# Patient Record
Sex: Female | Born: 1980 | Race: White | Hispanic: No | Marital: Married | Smoking: Never smoker
Health system: Southern US, Community
[De-identification: ages and names within clinical notes are randomized; demographics above are authoritative.]

---

## 2008-07-10 ENCOUNTER — Emergency Department (HOSPITAL_COMMUNITY): Admission: EM | Admit: 2008-07-10 | Discharge: 2008-07-10 | Payer: Self-pay | Admitting: Emergency Medicine

## 2009-06-08 IMAGING — CT CT HEAD W/O CM
1 series · 16 of 30 positions shown, 20 images · non-contrast
Comparison: No priors

CLINICAL DATA: Syncope/question seizure

CT HEAD WITHOUT CONTRAST
TECHNIQUE: Contiguous axial images were obtained from the base of
the skull through the vertex without contrast.

[Series 2: head_seq 4.5 h37s st · axial · 0.43mm/px · z∈[-127,-1]mm · 16 of 32 slices shown, 20 images]
[im 2/32  brain]
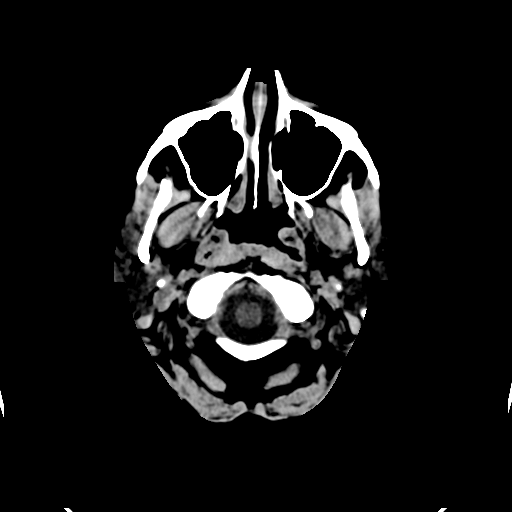
[im 2/32  bone]
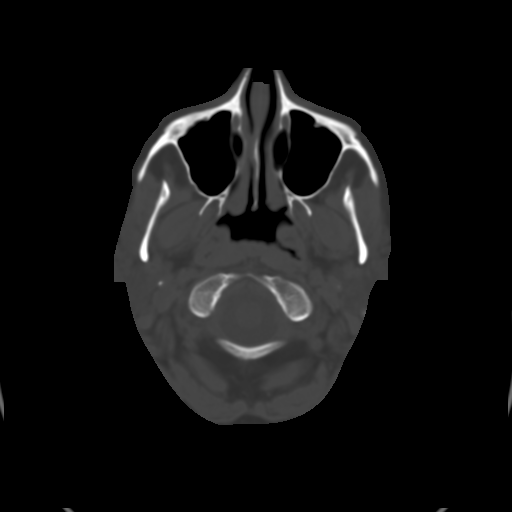
[im 4/32  brain]
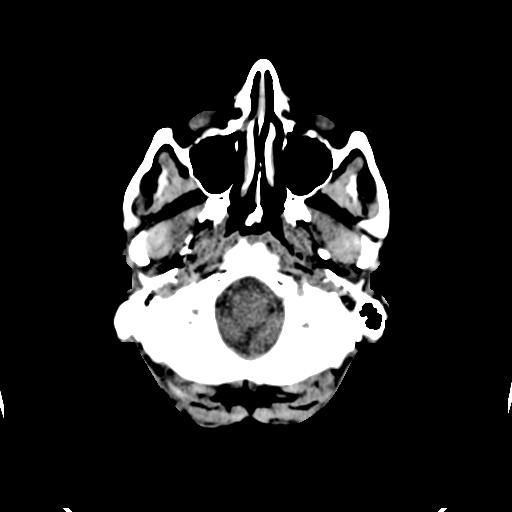
[im 6/32  brain]
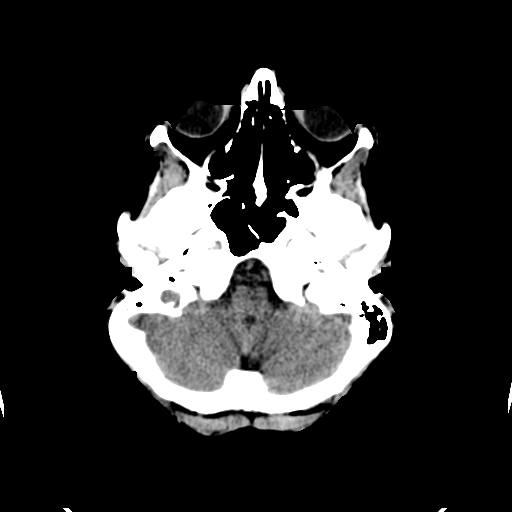
[im 8/32  brain]
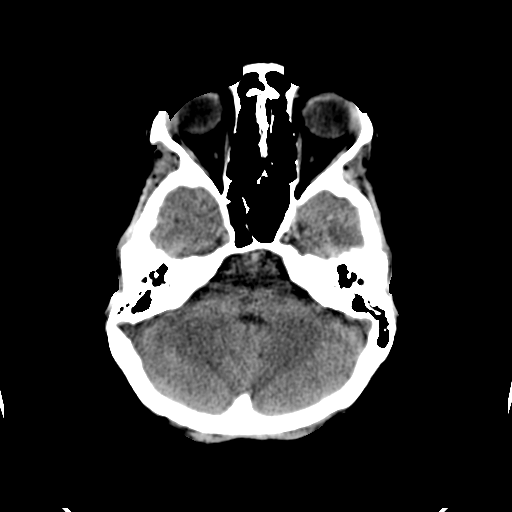
[im 9/32  brain]
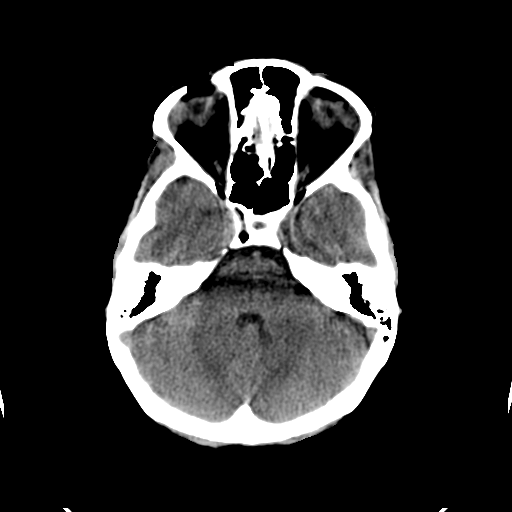
[im 9/32  bone]
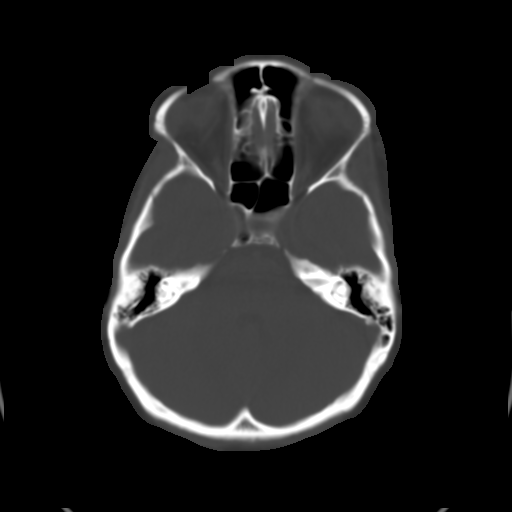
[im 11/32  brain]
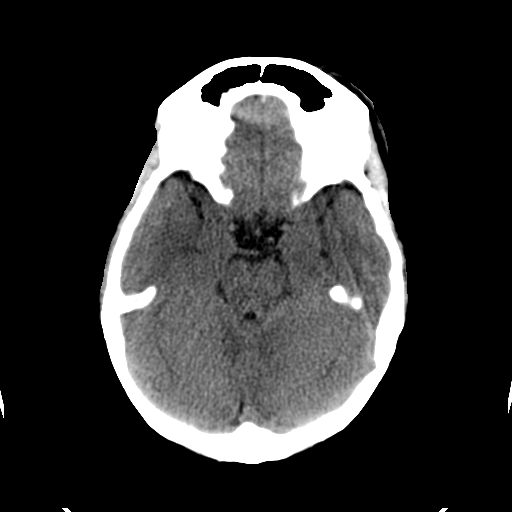
[im 13/32  brain]
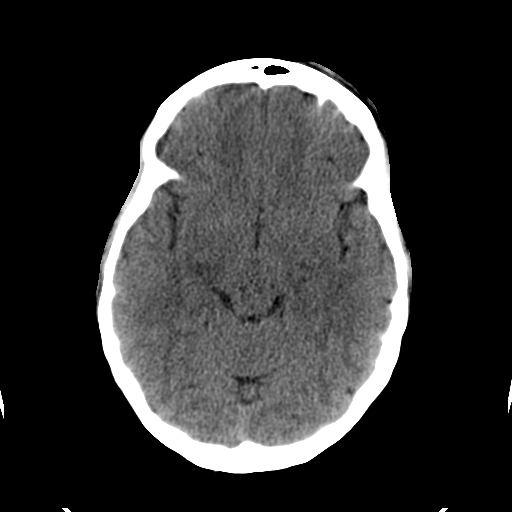
[im 15/32  brain]
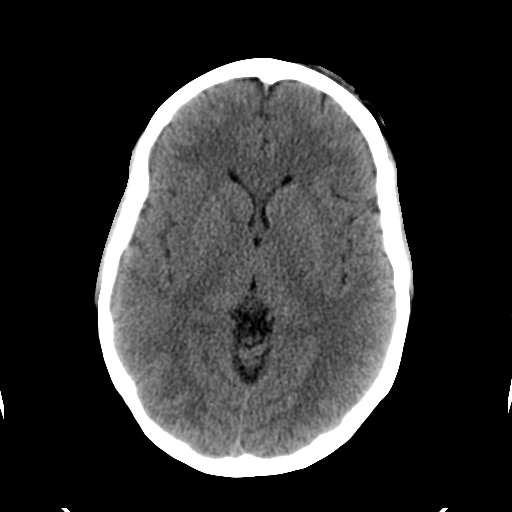
[im 17/32  brain]
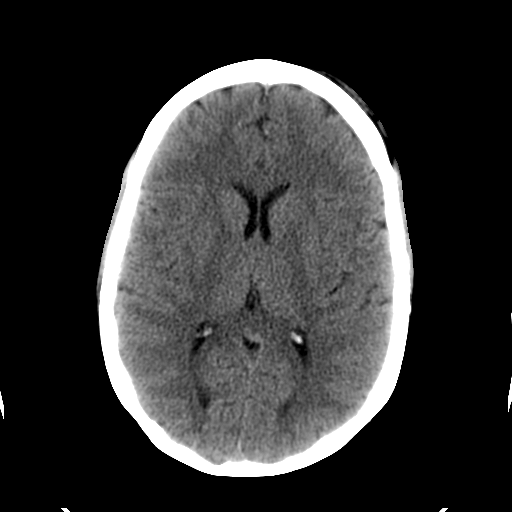
[im 17/32  bone]
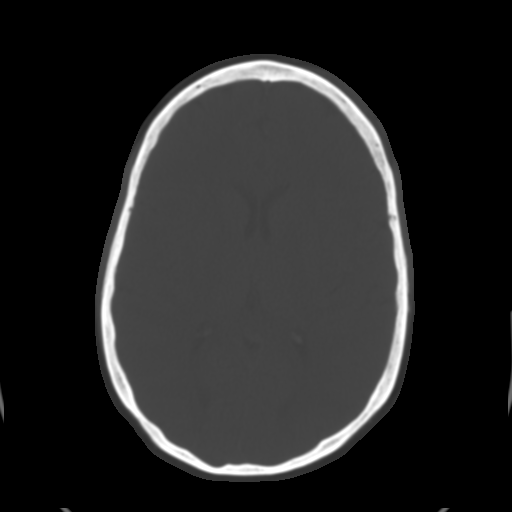
[im 19/32  brain]
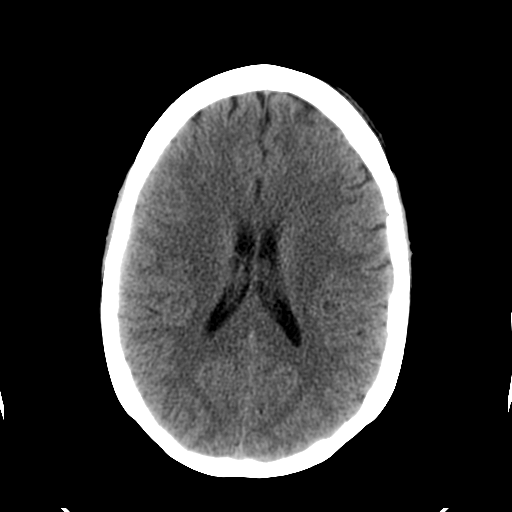
[im 21/32  brain]
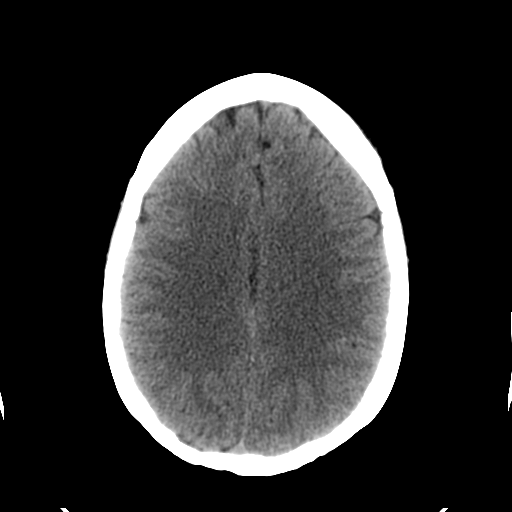
[im 23/32  brain]
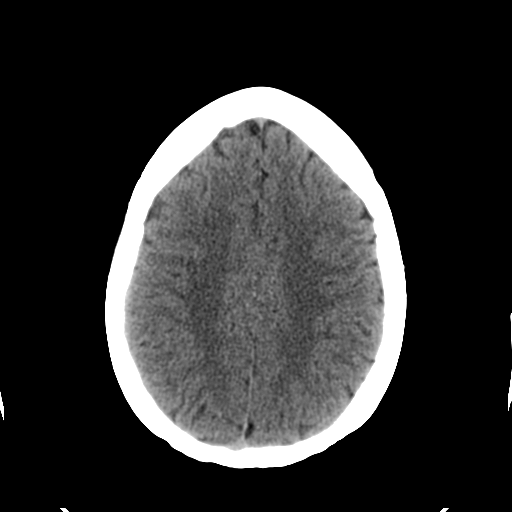
[im 24/32  brain]
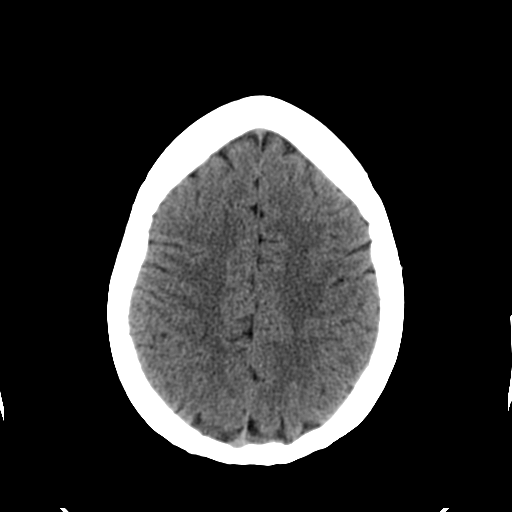
[im 24/32  bone]
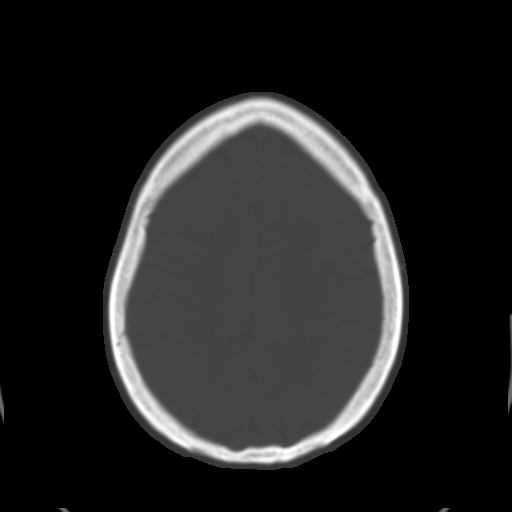
[im 26/32  brain]
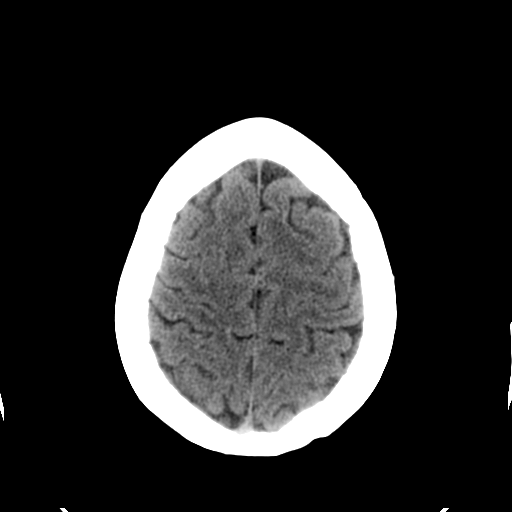
[im 28/32  brain]
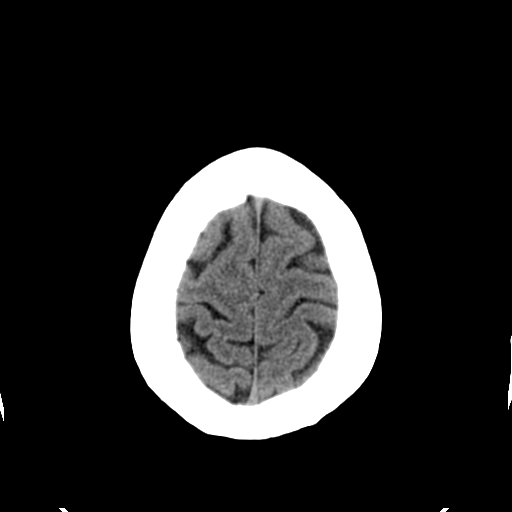
[im 30/32  brain]
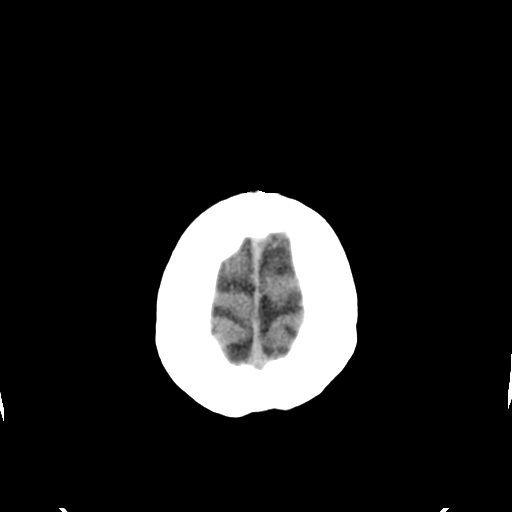

[16 of 30 positions shown; findings below may reference images not displayed]

FINDINGS: Ventricular size and CSF spaces normal.  No acute
infarct, bleed, or mass effect.  Calvarium intact.

There is soft tissue swelling and sub galeal hematoma over the left
orbit and left frontal area.
IMPRESSION: Normal except for soft tissue swelling with sub galeal hematoma
over the left orbit and frontal region.

## 2009-12-02 DIAGNOSIS — G4700 Insomnia, unspecified: Secondary | ICD-10-CM | POA: Insufficient documentation

## 2009-12-02 DIAGNOSIS — G2581 Restless legs syndrome: Secondary | ICD-10-CM | POA: Insufficient documentation

## 2011-03-23 DIAGNOSIS — F902 Attention-deficit hyperactivity disorder, combined type: Secondary | ICD-10-CM | POA: Insufficient documentation

## 2011-03-23 DIAGNOSIS — G40909 Epilepsy, unspecified, not intractable, without status epilepticus: Secondary | ICD-10-CM | POA: Insufficient documentation

## 2011-03-23 DIAGNOSIS — F419 Anxiety disorder, unspecified: Secondary | ICD-10-CM | POA: Insufficient documentation

## 2011-05-08 LAB — URINALYSIS, ROUTINE W REFLEX MICROSCOPIC
Bilirubin Urine: NEGATIVE
Glucose, UA: NEGATIVE mg/dL
Hgb urine dipstick: NEGATIVE
Ketones, ur: NEGATIVE mg/dL
Protein, ur: NEGATIVE mg/dL
Urobilinogen, UA: 1 mg/dL (ref 0.0–1.0)

## 2011-05-08 LAB — CBC
HCT: 36.2 % (ref 36.0–46.0)
Hemoglobin: 11.7 g/dL — ABNORMAL LOW (ref 12.0–15.0)
Platelets: 242 10*3/uL (ref 150–400)
RDW: 15.6 % — ABNORMAL HIGH (ref 11.5–15.5)
WBC: 10.3 10*3/uL (ref 4.0–10.5)

## 2011-05-08 LAB — DIFFERENTIAL
Basophils Absolute: 0.1 10*3/uL (ref 0.0–0.1)
Basophils Relative: 1 % (ref 0–1)
Eosinophils Absolute: 0.1 10*3/uL (ref 0.0–0.7)
Monocytes Absolute: 0.8 10*3/uL (ref 0.1–1.0)
Monocytes Relative: 8 % (ref 3–12)
Neutro Abs: 8.5 10*3/uL — ABNORMAL HIGH (ref 1.7–7.7)

## 2011-05-08 LAB — COMPREHENSIVE METABOLIC PANEL
ALT: 29 U/L (ref 0–35)
Albumin: 3.8 g/dL (ref 3.5–5.2)
Alkaline Phosphatase: 97 U/L (ref 39–117)
BUN: 11 mg/dL (ref 6–23)
Chloride: 107 mEq/L (ref 96–112)
Glucose, Bld: 90 mg/dL (ref 70–99)
Potassium: 4.1 mEq/L (ref 3.5–5.1)
Sodium: 138 mEq/L (ref 135–145)
Total Bilirubin: 0.6 mg/dL (ref 0.3–1.2)
Total Protein: 6.7 g/dL (ref 6.0–8.3)

## 2011-05-08 LAB — URINE MICROSCOPIC-ADD ON

## 2013-01-07 DIAGNOSIS — F339 Major depressive disorder, recurrent, unspecified: Secondary | ICD-10-CM | POA: Insufficient documentation

## 2016-01-09 DIAGNOSIS — K219 Gastro-esophageal reflux disease without esophagitis: Secondary | ICD-10-CM | POA: Insufficient documentation

## 2016-01-09 DIAGNOSIS — G4733 Obstructive sleep apnea (adult) (pediatric): Secondary | ICD-10-CM | POA: Insufficient documentation

## 2017-05-13 DIAGNOSIS — E6609 Other obesity due to excess calories: Secondary | ICD-10-CM | POA: Insufficient documentation

## 2019-03-08 ENCOUNTER — Ambulatory Visit (INDEPENDENT_AMBULATORY_CARE_PROVIDER_SITE_OTHER): Payer: BC Managed Care – PPO | Admitting: Adult Health

## 2019-03-08 ENCOUNTER — Encounter (INDEPENDENT_AMBULATORY_CARE_PROVIDER_SITE_OTHER): Payer: Self-pay

## 2019-03-08 ENCOUNTER — Other Ambulatory Visit: Payer: Self-pay

## 2019-03-08 DIAGNOSIS — F909 Attention-deficit hyperactivity disorder, unspecified type: Secondary | ICD-10-CM

## 2019-03-08 DIAGNOSIS — G4700 Insomnia, unspecified: Secondary | ICD-10-CM | POA: Diagnosis not present

## 2019-03-08 DIAGNOSIS — F411 Generalized anxiety disorder: Secondary | ICD-10-CM | POA: Diagnosis not present

## 2019-03-08 DIAGNOSIS — F331 Major depressive disorder, recurrent, moderate: Secondary | ICD-10-CM

## 2019-03-09 ENCOUNTER — Other Ambulatory Visit: Payer: Self-pay

## 2019-03-10 ENCOUNTER — Other Ambulatory Visit: Payer: Self-pay

## 2019-03-10 MED ORDER — DULOXETINE HCL 60 MG PO CSDR: 60.0000 mg | DELAYED_RELEASE_CAPSULE | Freq: Every day | ORAL | 5 refills | Status: DC

## 2019-03-10 MED ORDER — DULOXETINE HCL 30 MG PO CSDR: 30.0000 mg | DELAYED_RELEASE_CAPSULE | Freq: Every day | ORAL | 5 refills | Status: DC

## 2019-03-13 ENCOUNTER — Other Ambulatory Visit: Payer: Self-pay

## 2019-03-13 MED ORDER — DULOXETINE HCL 60 MG PO CPEP: 60.0000 mg | ORAL_CAPSULE | Freq: Every day | ORAL | 5 refills | Status: DC

## 2019-03-13 MED ORDER — DULOXETINE HCL 30 MG PO CPEP: 30.0000 mg | ORAL_CAPSULE | Freq: Every day | ORAL | 5 refills | Status: DC

## 2019-03-25 ENCOUNTER — Encounter: Payer: Self-pay | Admitting: Adult Health

## 2019-03-25 DIAGNOSIS — R0683 Snoring: Secondary | ICD-10-CM | POA: Insufficient documentation

## 2019-03-25 DIAGNOSIS — Z9889 Other specified postprocedural states: Secondary | ICD-10-CM | POA: Insufficient documentation

## 2019-03-25 NOTE — Progress Notes (Signed)
Tiffany Francis [id number] [birth year] 38 y.o.  Subjective:   Patient ID:  Tiffany Francis is a 38 y.o. (DOB [birth year]) female.  Chief Complaint: No chief complaint on file.   HPI Tiffany Francis presents to the office today for follow-up of ADHD, anxiety, depression, insomnia.  Describes mood today as "so-so". Pleasant. Mood symptoms - reports  depression, anxiety, and irritability. She and husband having issues. Has attended marriage counseling and also decided to pursue individual therapy. Husband has told her he doesn't "love" her anymore. Has been frustrated with her not getting things done. Has not been doing things around the house. Addition of Adderall has not helped. 39 year old son recently had a baby. Lives with girlfriend and her family. 44 year old son frustrated with going back to school. Work going better - has gotten some help. Stable interest and motivation. Taking medications as prescribed.  Energy levels decreased overall. Active, does not have a regular exercise routine. Works full time. Enjoys some usual interests and activities. Spending time with family. Appetite adequate. Weight stable. Sleeps well most nights. Averages 6 to 8 hours. Sleeping in a separate room.  Focus and concentration stable. Completing tasks. Managing aspects of household.  Denies SI or HI. Denies AH or VH.  Review of Systems:  Review of Systems  Musculoskeletal: Negative for gait problem.  Neurological: Negative for tremors.  Psychiatric/Behavioral:       Please refer to HPI    Medications: I have reviewed the patient's current medications.  Current Outpatient Medications  Medication Sig Dispense Refill  . DULoxetine (CYMBALTA) 30 MG capsule Take 1 capsule (30 mg total) by mouth daily. 30 capsule 5  . DULoxetine (CYMBALTA) 60 MG capsule Take 1 capsule (60 mg total) by mouth daily. 30 capsule 5   No current facility-administered medications for this visit.     Medication Side Effects:  None  Allergies: Not on File  No past medical history on file.  No family history on file.  Social History   Socioeconomic History  . Marital status: Married    Spouse name: Not on file  . Number of children: Not on file  . Years of education: Not on file  . Highest education level: Not on file  Occupational History  . Not on file  Social Needs  . Financial resource strain: Not on file  . Food insecurity    Worry: Not on file    Inability: Not on file  . Transportation needs    Medical: Not on file    Non-medical: Not on file  Tobacco Use  . Smoking status: Not on file  Substance and Sexual Activity  . Alcohol use: Not on file  . Drug use: Not on file  . Sexual activity: Not on file  Lifestyle  . Physical activity    Days per week: Not on file    Minutes per session: Not on file  . Stress: Not on file  Relationships  . Social Herbalist on phone: Not on file    Gets together: Not on file    Attends religious service: Not on file    Active member of club or organization: Not on file    Attends meetings of clubs or organizations: Not on file    Relationship status: Not on file  . Intimate partner violence    Fear of current or ex partner: Not on file    Emotionally abused: Not on file    Physically abused: Not  on file    Forced sexual activity: Not on file  Other Topics Concern  . Not on file  Social History Narrative  . Not on file    Past Medical History, Surgical history, Social history, and Family history were reviewed and updated as appropriate.   Please see review of systems for further details on the patient's review from today.   Objective:   Physical Exam:  There were no vitals taken for this visit.  Physical Exam Constitutional:      General: She is not in acute distress.    Appearance: She is well-developed.  Musculoskeletal:        General: No deformity.  Neurological:     Mental Status: She is alert and oriented to person,  place, and time.     Coordination: Coordination normal.  Psychiatric:        Attention and Perception: Attention and perception normal. She does not perceive auditory or visual hallucinations.        Mood and Affect: Mood normal. Mood is not anxious or depressed. Affect is not labile, blunt, angry or inappropriate.        Speech: Speech normal.        Behavior: Behavior normal.        Thought Content: Thought content normal. Thought content is not paranoid or delusional. Thought content does not include homicidal or suicidal ideation. Thought content does not include homicidal or suicidal plan.        Cognition and Memory: Cognition and memory normal.        Judgment: Judgment normal.     Comments: Insight intact     Lab Review:     Component Value Date/Time   NA 138 07/10/2008 1020   K 4.1 07/10/2008 1020   CL 107 07/10/2008 1020   CO2 24 07/10/2008 1020   GLUCOSE 90 07/10/2008 1020   BUN 11 07/10/2008 1020   CREATININE 0.66 07/10/2008 1020   CALCIUM 8.7 07/10/2008 1020   PROT 6.7 07/10/2008 1020   ALBUMIN 3.8 07/10/2008 1020   AST 26 07/10/2008 1020   ALT 29 07/10/2008 1020   ALKPHOS 97 07/10/2008 1020   BILITOT 0.6 07/10/2008 1020   GFRNONAA >60 07/10/2008 1020   GFRAA  07/10/2008 1020    >60        The eGFR has been calculated using the MDRD equation. This calculation has not been validated in all clinical       Component Value Date/Time   WBC 10.3 07/10/2008 1020   RBC 4.51 07/10/2008 1020   HGB 11.7 (L) 07/10/2008 1020   HCT 36.2 07/10/2008 1020   PLT 242 07/10/2008 1020   MCV 80.3 07/10/2008 1020   MCHC 32.2 07/10/2008 1020   RDW 15.6 (H) 07/10/2008 1020   LYMPHSABS 0.8 07/10/2008 1020   MONOABS 0.8 07/10/2008 1020   EOSABS 0.1 07/10/2008 1020   BASOSABS 0.1 07/10/2008 1020    No results found for: POCLITH, LITHIUM   No results found for: PHENYTOIN, PHENOBARB, VALPROATE, CBMZ   .res Assessment: Plan:    Plan:  1. D/C Adderall 87m tablet in the  afternoon 2. Increase Cymbalta 60/30 to 60/60 to target depression 3. Paxil 453mdaily 4. Xanax 33m72m 4 x daily 5. Vyvanse 5m77mily 6. Abilify 10mg49mly  RTC 4 weeks  Patient advised to contact office with any questions, adverse effects, or acute worsening in signs and symptoms.  Discussed potential metabolic side effects associated with atypical antipsychotics, as well  as potential risk for movement side effects. Advised pt to contact office if movement side effects occur.   Discussed potential benefits, risks, and side effects of stimulants with patient to include increased heart rate, palpitations, insomnia, increased anxiety, increased irritability, or decreased appetite.  Instructed patient to contact office if experiencing any significant tolerability issues.  Discussed potential benefits, risk, and side effects of benzodiazepines to include potential risk of tolerance and dependence, as well as possible drowsiness.  Advised patient not to drive if experiencing drowsiness and to take lowest possible effective dose to minimize risk of dependence and tolerance.  There are no diagnoses linked to this encounter.   Please see After Visit Summary for patient specific instructions.  Future Appointments  Date Time Provider [place]  04/05/2019  5:20 PM Dunya Meiners, Berdie Ogren, NP [facility] None    No orders of the defined types were placed in this encounter.   -------------------------------

## 2019-03-28 ENCOUNTER — Telehealth: Payer: Self-pay | Admitting: Adult Health

## 2019-03-28 NOTE — Telephone Encounter (Signed)
Last refill 03/14/2019 not sure why she's requesting refill prior to appt. Too soon to fill yet.

## 2019-03-28 NOTE — Telephone Encounter (Signed)
Patient called and said that she needs a refill on her vyvanse 60 mg  To be sent to the [facility] on [address] in [place]. Her next appt is 9/2

## 2019-04-05 ENCOUNTER — Ambulatory Visit: Payer: BC Managed Care – PPO | Admitting: Adult Health

## 2019-04-05 ENCOUNTER — Ambulatory Visit (INDEPENDENT_AMBULATORY_CARE_PROVIDER_SITE_OTHER): Payer: BC Managed Care – PPO | Admitting: Adult Health

## 2019-04-05 ENCOUNTER — Other Ambulatory Visit: Payer: Self-pay

## 2019-04-05 ENCOUNTER — Encounter: Payer: Self-pay | Admitting: Adult Health

## 2019-04-05 DIAGNOSIS — F902 Attention-deficit hyperactivity disorder, combined type: Secondary | ICD-10-CM | POA: Diagnosis not present

## 2019-04-05 DIAGNOSIS — G4700 Insomnia, unspecified: Secondary | ICD-10-CM | POA: Diagnosis not present

## 2019-04-05 DIAGNOSIS — F411 Generalized anxiety disorder: Secondary | ICD-10-CM

## 2019-04-05 DIAGNOSIS — F331 Major depressive disorder, recurrent, moderate: Secondary | ICD-10-CM | POA: Diagnosis not present

## 2019-04-05 MED ORDER — LISDEXAMFETAMINE DIMESYLATE 60 MG PO CAPS
60.0000 mg | ORAL_CAPSULE | ORAL | 0 refills | Status: DC
Start: 2019-05-03 — End: 2019-07-05

## 2019-04-05 MED ORDER — DULOXETINE HCL 60 MG PO CPEP: 60.0000 mg | ORAL_CAPSULE | Freq: Two times a day (BID) | ORAL | 5 refills | Status: DC

## 2019-04-05 MED ORDER — LISDEXAMFETAMINE DIMESYLATE 60 MG PO CAPS: 60.0000 mg | ORAL_CAPSULE | Freq: Every day | ORAL | 0 refills | Status: DC

## 2019-04-05 MED ORDER — LISDEXAMFETAMINE DIMESYLATE 60 MG PO CAPS: 60.0000 mg | ORAL_CAPSULE | ORAL | 0 refills | Status: DC

## 2019-04-05 NOTE — Progress Notes (Signed)
Tiffany Francis [id number] 08/12/1980 38 y.o.  Subjective:   Patient ID:  Tiffany Francis is a 38 y.o. (DOB Nov 22, 1980) female.  Chief Complaint: No chief complaint on file.   HPI Tiffany Francis presents to the office today for follow-up of ADHD, anxiety, depression, insomnia.  Describes mood today as "ok". Pleasant. Mood symptoms - reports decreased depression, anxiety, and irritability. She and husband are talking, but not about their relationship. Has stopped therapy. Stating "my son only calls me when he needs something". Called yesterday wanting "money". Has been able to see grandson once a week. Doing a "little" better getting things done around the house. Work going well - returning to office soon.  Stable interest and motivation. Taking medications as prescribed.  Energy levels decreased overall. Active, does not have a regular exercise routine. Works full time for [employer] Enjoys some usual interests and activities. Spending time with family - husband and son "[place]". Appetite adequate. Weight stable. Sleeps well most nights. Averages 6 to 8 hours.  Focus and concentration stable. Completing tasks. Managing aspects of household.  Denies SI or HI. Denies AH or VH.  Review of Systems:  Review of Systems  Musculoskeletal: Negative for gait problem.  Neurological: Negative for tremors.  Psychiatric/Behavioral:       Please refer to HPI    Medications: I have reviewed the patient's current medications.  Current Outpatient Medications  Medication Sig Dispense Refill  . ALPRAZolam (XANAX) 1 MG tablet take 1 tablet by mouth four times a day if needed for anxiety    . ARIPiprazole (ABILIFY) 10 MG tablet TK 1 T PO QD    . DULoxetine (CYMBALTA) 60 MG capsule Take 1 capsule (60 mg total) by mouth 2 (two) times daily. 60 capsule 5  . lisdexamfetamine (VYVANSE) 60 MG capsule Take 1 capsule (60 mg total) by mouth daily. 30 capsule 0  . [START ON 05/03/2019] lisdexamfetamine (VYVANSE) 60  MG capsule Take 1 capsule (60 mg total) by mouth every morning. 30 capsule 0  . [START ON 05/31/2019] lisdexamfetamine (VYVANSE) 60 MG capsule Take 1 capsule (60 mg total) by mouth every morning. 30 capsule 0  . meloxicam (MOBIC) 15 MG tablet     . omeprazole (PRILOSEC) 40 MG capsule     . rOPINIRole (REQUIP) 1 MG tablet Take one tablet at bedtime.    Marland Kitchen tiZANidine (ZANAFLEX) 4 MG tablet TK 1 TO 1 AND 1/2 TS PO UP TO TID PRN     No current facility-administered medications for this visit.     Medication Side Effects: None  Allergies: Not on File  No past medical history on file.  No family history on file.  Social History   Socioeconomic History  . Marital status: Married    Spouse name: Not on file  . Number of children: Not on file  . Years of education: Not on file  . Highest education level: Not on file  Occupational History  . Not on file  Social Needs  . Financial resource strain: Not on file  . Food insecurity    Worry: Not on file    Inability: Not on file  . Transportation needs    Medical: Not on file    Non-medical: Not on file  Tobacco Use  . Smoking status: Never Smoker  . Smokeless tobacco: Never Used  Substance and Sexual Activity  . Alcohol use: Not on file  . Drug use: Not on file  . Sexual activity: Not on file  Lifestyle  .  Physical activity    Days per week: Not on file    Minutes per session: Not on file  . Stress: Not on file  Relationships  . Social Herbalist on phone: Not on file    Gets together: Not on file    Attends religious service: Not on file    Active member of club or organization: Not on file    Attends meetings of clubs or organizations: Not on file    Relationship status: Not on file  . Intimate partner violence    Fear of current or ex partner: Not on file    Emotionally abused: Not on file    Physically abused: Not on file    Forced sexual activity: Not on file  Other Topics Concern  . Not on file  Social  History Narrative  . Not on file    Past Medical History, Surgical history, Social history, and Family history were reviewed and updated as appropriate.   Please see review of systems for further details on the patient's review from today.   Objective:   Physical Exam:  There were no vitals taken for this visit.  Physical Exam Constitutional:      General: She is not in acute distress.    Appearance: She is well-developed.  Musculoskeletal:        General: No deformity.  Neurological:     Mental Status: She is alert and oriented to person, place, and time.     Coordination: Coordination normal.  Psychiatric:        Attention and Perception: Attention and perception normal. She does not perceive auditory or visual hallucinations.        Mood and Affect: Mood normal. Mood is not anxious or depressed. Affect is not labile, blunt, angry or inappropriate.        Speech: Speech normal.        Behavior: Behavior normal.        Thought Content: Thought content normal. Thought content is not paranoid or delusional. Thought content does not include homicidal or suicidal ideation. Thought content does not include homicidal or suicidal plan.        Cognition and Memory: Cognition and memory normal.        Judgment: Judgment normal.     Comments: Insight intact     Lab Review:     Component Value Date/Time   NA 138 07/10/2008 1020   K 4.1 07/10/2008 1020   CL 107 07/10/2008 1020   CO2 24 07/10/2008 1020   GLUCOSE 90 07/10/2008 1020   BUN 11 07/10/2008 1020   CREATININE 0.66 07/10/2008 1020   CALCIUM 8.7 07/10/2008 1020   PROT 6.7 07/10/2008 1020   ALBUMIN 3.8 07/10/2008 1020   AST 26 07/10/2008 1020   ALT 29 07/10/2008 1020   ALKPHOS 97 07/10/2008 1020   BILITOT 0.6 07/10/2008 1020   GFRNONAA >60 07/10/2008 1020   GFRAA  07/10/2008 1020    >60        The eGFR has been calculated using the MDRD equation. This calculation has not been validated in all clinical        Component Value Date/Time   WBC 10.3 07/10/2008 1020   RBC 4.51 07/10/2008 1020   HGB 11.7 (L) 07/10/2008 1020   HCT 36.2 07/10/2008 1020   PLT 242 07/10/2008 1020   MCV 80.3 07/10/2008 1020   MCHC 32.2 07/10/2008 1020   RDW 15.6 (H) 07/10/2008 1020  LYMPHSABS 0.8 07/10/2008 1020   MONOABS 0.8 07/10/2008 1020   EOSABS 0.1 07/10/2008 1020   BASOSABS 0.1 07/10/2008 1020    No results found for: POCLITH, LITHIUM   No results found for: PHENYTOIN, PHENOBARB, VALPROATE, CBMZ   .res Assessment: Plan:    Plan:  Increase Cymbalta 60/60 to target depression Paxil 71m daily Xanax 176m- 4 x daily Vyvanse 6012maily  Abilify 75m62mily  RTC 4 weeks  Patient advised to contact office with any questions, adverse effects, or acute worsening in signs and symptoms.  Discussed potential metabolic side effects associated with atypical antipsychotics, as well as potential risk for movement side effects. Advised pt to contact office if movement side effects occur.   Discussed potential benefits, risks, and side effects of stimulants with patient to include increased heart rate, palpitations, insomnia, increased anxiety, increased irritability, or decreased appetite.  Instructed patient to contact office if experiencing any significant tolerability issues.  Discussed potential benefits, risk, and side effects of benzodiazepines to include potential risk of tolerance and dependence, as well as possible drowsiness.  Advised patient not to drive if experiencing drowsiness and to take lowest possible effective dose to minimize risk of dependence and tolerance.  Diagnoses and all orders for this visit:  Generalized anxiety disorder -     DULoxetine (CYMBALTA) 60 MG capsule; Take 1 capsule (60 mg total) by mouth 2 (two) times daily.  Major depressive disorder, recurrent episode, moderate (HCC) -     DULoxetine (CYMBALTA) 60 MG capsule; Take 1 capsule (60 mg total) by mouth 2 (two) times  daily.  Attention deficit hyperactivity disorder (ADHD), combined type -     lisdexamfetamine (VYVANSE) 60 MG capsule; Take 1 capsule (60 mg total) by mouth daily. -     lisdexamfetamine (VYVANSE) 60 MG capsule; Take 1 capsule (60 mg total) by mouth every morning. -     lisdexamfetamine (VYVANSE) 60 MG capsule; Take 1 capsule (60 mg total) by mouth every morning.  Insomnia, unspecified type     Please see After Visit Summary for patient specific instructions.  No future appointments.  No orders of the defined types were placed in this encounter.   -------------------------------

## 2019-07-05 ENCOUNTER — Encounter: Payer: Self-pay | Admitting: Adult Health

## 2019-07-05 ENCOUNTER — Ambulatory Visit (INDEPENDENT_AMBULATORY_CARE_PROVIDER_SITE_OTHER): Payer: BC Managed Care – PPO | Admitting: Adult Health

## 2019-07-05 ENCOUNTER — Encounter (INDEPENDENT_AMBULATORY_CARE_PROVIDER_SITE_OTHER): Payer: Self-pay

## 2019-07-05 ENCOUNTER — Other Ambulatory Visit: Payer: Self-pay

## 2019-07-05 DIAGNOSIS — F902 Attention-deficit hyperactivity disorder, combined type: Secondary | ICD-10-CM | POA: Diagnosis not present

## 2019-07-05 DIAGNOSIS — F411 Generalized anxiety disorder: Secondary | ICD-10-CM

## 2019-07-05 DIAGNOSIS — G4700 Insomnia, unspecified: Secondary | ICD-10-CM

## 2019-07-05 DIAGNOSIS — F331 Major depressive disorder, recurrent, moderate: Secondary | ICD-10-CM

## 2019-07-05 MED ORDER — LISDEXAMFETAMINE DIMESYLATE 60 MG PO CAPS: 60.0000 mg | ORAL_CAPSULE | ORAL | 0 refills | Status: DC

## 2019-07-05 MED ORDER — LISDEXAMFETAMINE DIMESYLATE 60 MG PO CAPS: 60.0000 mg | ORAL_CAPSULE | Freq: Every day | ORAL | 0 refills | Status: DC

## 2019-07-05 MED ORDER — ALPRAZOLAM 1 MG PO TABS: ORAL_TABLET | ORAL | 2 refills | Status: DC

## 2019-07-05 NOTE — Progress Notes (Signed)
Tiffany Francis [id number] 04-02-1981 38 y.o.  Subjective:   Patient ID:  Tiffany Francis is a 38 y.o. (DOB Jun 05, 1981) female.  Chief Complaint: No chief complaint on file.   HPI Tiffany Francis presents to the office today for follow-up of ADHD, anxiety, depression, insomnia.  Describes mood today as "so-so". Pleasant. Mood symptoms - reports depression, anxiety, and irritability - "some days are better than others". Complaining of low interest and motivaton. Younger son has helped her get some things done at home - "cleaned out closet". Has been working overtime for past few months. Stating "that has made me get even further behind on things at home". Stating "I have no motivation to clean" once I get home. House is "dirty and out of control". Has thought about hiring someone to help around the house.Husband at home, but sleeps in another room now. Husband says he loves her, but is not in love with her. Is "nice' to her, but they do not have any "sexual" relations. Has a son and a grandchild - asking for and money for things. Feels like son is not being treated fairly. Does not like his fiance or her family. Recent visit with neurologist and everything was normal - "blood work good". Decreased interest and motivation. Taking medications as prescribed.  Energy levels "ok". Active, does not have a regular exercise routine. Works full time for [employer]. Enjoys some usual interests and activities. Spending time with family - husband and sons. Appetite adequate. Weight stable. Sleeps well most nights. Averages 6 to 8 hours - "tosses and turns".  Focus and concentration stable. Completing tasks. Managing "some" aspects of household. Work going well.  Denies SI or HI. Denies AH or VH.  Review of Systems:  Review of Systems  Musculoskeletal: Negative for gait problem.  Neurological: Negative for tremors and weakness.  Psychiatric/Behavioral: Negative for agitation, confusion and  self-injury.       Please refer to HPI    Medications: I have reviewed the patient's current medications.  Current Outpatient Medications  Medication Sig Dispense Refill  . ALPRAZolam (XANAX) 1 MG tablet take 1 tablet by mouth four times a day if needed for anxiety 120 tablet 2  . ARIPiprazole (ABILIFY) 10 MG tablet TK 1 T PO QD    . DULoxetine (CYMBALTA) 60 MG capsule Take 1 capsule (60 mg total) by mouth 2 (two) times daily. 60 capsule 5  . lisdexamfetamine (VYVANSE) 60 MG capsule Take 1 capsule (60 mg total) by mouth every morning. 30 capsule 0  . [START ON 08/02/2019] lisdexamfetamine (VYVANSE) 60 MG capsule Take 1 capsule (60 mg total) by mouth every morning. 30 capsule 0  . [START ON 08/30/2019] lisdexamfetamine (VYVANSE) 60 MG capsule Take 1 capsule (60 mg total) by mouth daily. 30 capsule 0  . meloxicam (MOBIC) 15 MG tablet     . omeprazole (PRILOSEC) 40 MG capsule     . rOPINIRole (REQUIP) 1 MG tablet Take one tablet at bedtime.    Marland Kitchen tiZANidine (ZANAFLEX) 4 MG tablet TK 1 TO 1 AND 1/2 TS PO UP TO TID PRN     No current facility-administered medications for this visit.     Medication Side Effects: None  Allergies: Not on File  No past medical history on file.  No family history on file.  Social History   Socioeconomic History  . Marital status: Married    Spouse name: Not on file  . Number of children: Not on file  . Years  of education: Not on file  . Highest education level: Not on file  Occupational History  . Not on file  Social Needs  . Financial resource strain: Not on file  . Food insecurity    Worry: Not on file    Inability: Not on file  . Transportation needs    Medical: Not on file    Non-medical: Not on file  Tobacco Use  . Smoking status: Never Smoker  . Smokeless tobacco: Never Used  Substance and Sexual Activity  . Alcohol use: Not on file  . Drug use: Not on file  . Sexual activity: Not on file  Lifestyle  . Physical activity    Days per  week: Not on file    Minutes per session: Not on file  . Stress: Not on file  Relationships  . Social Herbalist on phone: Not on file    Gets together: Not on file    Attends religious service: Not on file    Active member of club or organization: Not on file    Attends meetings of clubs or organizations: Not on file    Relationship status: Not on file  . Intimate partner violence    Fear of current or ex partner: Not on file    Emotionally abused: Not on file    Physically abused: Not on file    Forced sexual activity: Not on file  Other Topics Concern  . Not on file  Social History Narrative  . Not on file    Past Medical History, Surgical history, Social history, and Family history were reviewed and updated as appropriate.   Please see review of systems for further details on the patient's review from today.   Objective:   Physical Exam:  There were no vitals taken for this visit.  Physical Exam Constitutional:      General: She is not in acute distress.    Appearance: She is well-developed.  Musculoskeletal:        General: No deformity.  Neurological:     Mental Status: She is alert and oriented to person, place, and time.     Coordination: Coordination normal.  Psychiatric:        Attention and Perception: Attention and perception normal. She does not perceive auditory or visual hallucinations.        Mood and Affect: Mood is anxious. Mood is not depressed. Affect is not labile, blunt, angry or inappropriate.        Speech: Speech normal.        Behavior: Behavior normal.        Thought Content: Thought content normal. Thought content is not paranoid or delusional. Thought content does not include homicidal or suicidal ideation. Thought content does not include homicidal or suicidal plan.        Cognition and Memory: Cognition and memory normal.        Judgment: Judgment normal.     Comments: Insight intact     Lab Review:     Component Value  Date/Time   NA 138 07/10/2008 1020   K 4.1 07/10/2008 1020   CL 107 07/10/2008 1020   CO2 24 07/10/2008 1020   GLUCOSE 90 07/10/2008 1020   BUN 11 07/10/2008 1020   CREATININE 0.66 07/10/2008 1020   CALCIUM 8.7 07/10/2008 1020   PROT 6.7 07/10/2008 1020   ALBUMIN 3.8 07/10/2008 1020   AST 26 07/10/2008 1020   ALT 29 07/10/2008 1020  ALKPHOS 97 07/10/2008 1020   BILITOT 0.6 07/10/2008 1020   GFRNONAA >60 07/10/2008 1020   GFRAA  07/10/2008 1020    >60        The eGFR has been calculated using the MDRD equation. This calculation has not been validated in all clinical       Component Value Date/Time   WBC 10.3 07/10/2008 1020   RBC 4.51 07/10/2008 1020   HGB 11.7 (L) 07/10/2008 1020   HCT 36.2 07/10/2008 1020   PLT 242 07/10/2008 1020   MCV 80.3 07/10/2008 1020   MCHC 32.2 07/10/2008 1020   RDW 15.6 (H) 07/10/2008 1020   LYMPHSABS 0.8 07/10/2008 1020   MONOABS 0.8 07/10/2008 1020   EOSABS 0.1 07/10/2008 1020   BASOSABS 0.1 07/10/2008 1020    No results found for: POCLITH, LITHIUM   No results found for: PHENYTOIN, PHENOBARB, VALPROATE, CBMZ   .res Assessment: Plan:    Plan:  Increase Cymbalta 60/60 to target depression Paxil 50m daily Xanax 124m- 4 x daily - usually takes TID Vyvanse 6067maily  Abilify 80m31mily  RTC 4 weeks  Patient advised to contact office with any questions, adverse effects, or acute worsening in signs and symptoms.  Discussed potential metabolic side effects associated with atypical antipsychotics, as well as potential risk for movement side effects. Advised pt to contact office if movement side effects occur.   Discussed potential benefits, risks, and side effects of stimulants with patient to include increased heart rate, palpitations, insomnia, increased anxiety, increased irritability, or decreased appetite.  Instructed patient to contact office if experiencing any significant tolerability issues.  Discussed potential benefits,  risk, and side effects of benzodiazepines to include potential risk of tolerance and dependence, as well as possible drowsiness.  Advised patient not to drive if experiencing drowsiness and to take lowest possible effective dose to minimize risk of dependence and tolerance.  Diagnoses and all orders for this visit:  Insomnia, unspecified type  Attention deficit hyperactivity disorder (ADHD), combined type -     lisdexamfetamine (VYVANSE) 60 MG capsule; Take 1 capsule (60 mg total) by mouth every morning. -     lisdexamfetamine (VYVANSE) 60 MG capsule; Take 1 capsule (60 mg total) by mouth every morning. -     lisdexamfetamine (VYVANSE) 60 MG capsule; Take 1 capsule (60 mg total) by mouth daily.  Major depressive disorder, recurrent episode, moderate (HCC)  Generalized anxiety disorder -     ALPRAZolam (XANAX) 1 MG tablet; take 1 tablet by mouth four times a day if needed for anxiety     Please see After Visit Summary for patient specific instructions.  No future appointments.  No orders of the defined types were placed in this encounter.   -------------------------------

## 2019-08-31 ENCOUNTER — Telehealth: Payer: Self-pay

## 2019-08-31 NOTE — Telephone Encounter (Signed)
Contacted [facility] at [phone] to submit a prior authorization over the phone for Vyvanse 60 mg. Criteria questions answered and confirmed diagnosis of ADHD. Information is forwarded to pharmacist for review. Typical response time is 48-72 hours, should receive a fax.   Confirmation # [id number]

## 2019-09-06 NOTE — Telephone Encounter (Signed)
Contacted [facility] [phone] to check status of PA on Vyvanse 60 mg, for some reason request had been canceled. However the respresentative reopened a new case, clinical questions answered. Prior authorization APPROVED for Vyvanse 60 mg 1 daily effective 09/06/2019-09/05/2022

## 2019-10-03 ENCOUNTER — Ambulatory Visit: Payer: BC Managed Care – PPO | Admitting: Adult Health

## 2019-10-04 ENCOUNTER — Ambulatory Visit (INDEPENDENT_AMBULATORY_CARE_PROVIDER_SITE_OTHER): Payer: BC Managed Care – PPO | Admitting: Adult Health

## 2019-10-04 ENCOUNTER — Encounter: Payer: Self-pay | Admitting: Adult Health

## 2019-10-04 ENCOUNTER — Other Ambulatory Visit: Payer: Self-pay

## 2019-10-04 DIAGNOSIS — F411 Generalized anxiety disorder: Secondary | ICD-10-CM | POA: Diagnosis not present

## 2019-10-04 DIAGNOSIS — G4700 Insomnia, unspecified: Secondary | ICD-10-CM | POA: Diagnosis not present

## 2019-10-04 DIAGNOSIS — F331 Major depressive disorder, recurrent, moderate: Secondary | ICD-10-CM

## 2019-10-04 DIAGNOSIS — G2581 Restless legs syndrome: Secondary | ICD-10-CM

## 2019-10-04 DIAGNOSIS — F902 Attention-deficit hyperactivity disorder, combined type: Secondary | ICD-10-CM

## 2019-10-04 MED ORDER — ALPRAZOLAM 1 MG PO TABS: ORAL_TABLET | ORAL | 2 refills | Status: DC

## 2019-10-04 MED ORDER — LISDEXAMFETAMINE DIMESYLATE 60 MG PO CAPS: 60.0000 mg | ORAL_CAPSULE | ORAL | 0 refills | Status: DC

## 2019-10-04 MED ORDER — PAROXETINE HCL 40 MG PO TABS: 40.0000 mg | ORAL_TABLET | ORAL | 5 refills | Status: DC

## 2019-10-04 MED ORDER — LISDEXAMFETAMINE DIMESYLATE 60 MG PO CAPS: 60.0000 mg | ORAL_CAPSULE | Freq: Every day | ORAL | 0 refills | Status: DC

## 2019-10-04 MED ORDER — DULOXETINE HCL 60 MG PO CPEP: 60.0000 mg | ORAL_CAPSULE | Freq: Two times a day (BID) | ORAL | 5 refills | Status: DC

## 2019-10-04 MED ORDER — ROPINIROLE HCL 1 MG PO TABS: 1.0000 mg | ORAL_TABLET | Freq: Every day | ORAL | 5 refills | Status: DC

## 2019-10-04 MED ORDER — ARIPIPRAZOLE 10 MG PO TABS: 10.0000 mg | ORAL_TABLET | Freq: Every day | ORAL | 5 refills | Status: DC
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
## 2020-01-03 ENCOUNTER — Other Ambulatory Visit: Payer: Self-pay

## 2020-01-03 ENCOUNTER — Ambulatory Visit (INDEPENDENT_AMBULATORY_CARE_PROVIDER_SITE_OTHER): Payer: BC Managed Care – PPO | Admitting: Adult Health

## 2020-01-03 DIAGNOSIS — F331 Major depressive disorder, recurrent, moderate: Secondary | ICD-10-CM | POA: Diagnosis not present

## 2020-01-03 DIAGNOSIS — G4700 Insomnia, unspecified: Secondary | ICD-10-CM

## 2020-01-03 DIAGNOSIS — G2581 Restless legs syndrome: Secondary | ICD-10-CM

## 2020-01-03 DIAGNOSIS — F411 Generalized anxiety disorder: Secondary | ICD-10-CM | POA: Diagnosis not present

## 2020-01-03 DIAGNOSIS — F902 Attention-deficit hyperactivity disorder, combined type: Secondary | ICD-10-CM | POA: Diagnosis not present

## 2020-01-03 NOTE — Progress Notes (Signed)
Tiffany Francis [id number] 06-17-1981 39 y.o.  Subjective:   Patient ID:  Tiffany Francis is a 39 y.o. (DOB 10/30/1980) female.  Chief Complaint: No chief complaint on file.   HPI Tiffany Francis presents to the office today for follow-up of ADHD, anxiety, depression, insomnia.  Describes mood today as "not good". Pleasant. Mood symptoms - reports depression, anxiety, and irritability. Stating "I'm really depressed". Does not feel like current medication regimen is working for her and is open to trying a different medication to manage symptoms. Stating "I don't love anything about myself. Also stating "I'm not the person I used to be". Having issues getting things done at the house. [place]. Coming home "sometimes". Having issues with her older son. Stating "I'm just going through the motions". Decreased interest and motivation. Taking medications as prescribed.  Energy levels "better some days than others". Active, does not have a regular exercise routine. Works full time for [employer]. Enjoys some usual interests and activities. Married. Spending time with family - husband and sons.Mostly staying home. Appetite adequate. Weight gain - "I can't seem to lose weight".  Sleeps well most nights. Averages 6 to 8 hours. Focus and concentration difficulties. Completing tasks. Managing some aspects of household - "I have a hard time doing that". Work going well - "picking back up".  Denies SI or HI. Denies AH or VH.  Review of Systems:  Review of Systems  Musculoskeletal: Negative for gait problem.  Neurological: Negative for tremors.  Psychiatric/Behavioral:       Please refer to HPI    Medications: I have reviewed the patient's current medications.  Current Outpatient Medications  Medication Sig Dispense Refill  . ALPRAZolam (XANAX) 1 MG tablet take 1 tablet by mouth four times a day if needed for anxiety 120 tablet 2  . DULoxetine  (CYMBALTA) 60 MG capsule Take 1 capsule (60 mg total) by mouth 2 (two) times daily. 60 capsule 5  . lisdexamfetamine (VYVANSE) 60 MG capsule Take 1 capsule (60 mg total) by mouth daily. 30 capsule 0  . [START ON 02/01/2020] lisdexamfetamine (VYVANSE) 60 MG capsule Take 1 capsule (60 mg total) by mouth every morning. 30 capsule 0  . [START ON 02/29/2020] lisdexamfetamine (VYVANSE) 60 MG capsule Take 1 capsule (60 mg total) by mouth every morning. 30 capsule 0  . meloxicam (MOBIC) 15 MG tablet     . omeprazole (PRILOSEC) 40 MG capsule     . PARoxetine (PAXIL) 40 MG tablet Take 1 tablet (40 mg total) by mouth every morning. 30 tablet 5  . rOPINIRole (REQUIP) 1 MG tablet Take 1 tablet (1 mg total) by mouth at bedtime. 30 tablet 5  . tiZANidine (ZANAFLEX) 4 MG tablet TK 1 TO 1 AND 1/2 TS PO UP TO TID PRN     No current facility-administered medications for this visit.    Medication Side Effects: None  Allergies: No Known Allergies  No past medical history on file.  No family history on file.  Social History   Socioeconomic History  . Marital status: Married    Spouse name: Not on file  . Number of children: Not on file  . Years of education: Not on file  . Highest education level: Not on file  Occupational History  . Not on file  Tobacco Use  . Smoking status: Never Smoker  . Smokeless tobacco: Never Used  Substance and Sexual Activity  . Alcohol use: Not on file  .  Drug use: Not on file  . Sexual activity: Not on file  Other Topics Concern  . Not on file  Social History Narrative  . Not on file   Social Determinants of Health   Financial Resource Strain:   . Difficulty of Paying Living Expenses:   Food Insecurity:   . Worried About Charity fundraiser in the Last Year:   . Arboriculturist in the Last Year:   Transportation Needs:   . Film/video editor (Medical):   Marland Kitchen Lack of Transportation (Non-Medical):   Physical Activity:   . Days of Exercise per Week:   .  Minutes of Exercise per Session:   Stress:   . Feeling of Stress :   Social Connections:   . Frequency of Communication with Friends and Family:   . Frequency of Social Gatherings with Friends and Family:   . Attends Religious Services:   . Active Member of Clubs or Organizations:   . Attends Archivist Meetings:   Marland Kitchen Marital Status:   Intimate Partner Violence:   . Fear of Current or Ex-Partner:   . Emotionally Abused:   Marland Kitchen Physically Abused:   . Sexually Abused:     Past Medical History, Surgical history, Social history, and Family history were reviewed and updated as appropriate.   Please see review of systems for further details on the patient's review from today.   Objective:   Physical Exam:  There were no vitals taken for this visit.  Physical Exam Constitutional:      General: She is not in acute distress. Musculoskeletal:        General: No deformity.  Neurological:     Mental Status: She is alert and oriented to person, place, and time.     Coordination: Coordination normal.  Psychiatric:        Attention and Perception: Attention and perception normal. She does not perceive auditory or visual hallucinations.        Mood and Affect: Mood is anxious and depressed. Affect is not labile, blunt, angry or inappropriate.        Speech: Speech normal.        Behavior: Behavior normal.        Thought Content: Thought content normal. Thought content is not paranoid or delusional. Thought content does not include homicidal or suicidal ideation. Thought content does not include homicidal or suicidal plan.        Cognition and Memory: Cognition and memory normal.        Judgment: Judgment normal.     Comments: Insight intact     Lab Review:     Component Value Date/Time   NA 138 07/10/2008 1020   K 4.1 07/10/2008 1020   CL 107 07/10/2008 1020   CO2 24 07/10/2008 1020   GLUCOSE 90 07/10/2008 1020   BUN 11 07/10/2008 1020   CREATININE 0.66 07/10/2008 1020    CALCIUM 8.7 07/10/2008 1020   PROT 6.7 07/10/2008 1020   ALBUMIN 3.8 07/10/2008 1020   AST 26 07/10/2008 1020   ALT 29 07/10/2008 1020   ALKPHOS 97 07/10/2008 1020   BILITOT 0.6 07/10/2008 1020   GFRNONAA >60 07/10/2008 1020   GFRAA  07/10/2008 1020    >60        The eGFR has been calculated using the MDRD equation. This calculation has not been validated in all clinical       Component Value Date/Time   WBC 10.3 07/10/2008 1020  RBC 4.51 07/10/2008 1020   HGB 11.7 (L) 07/10/2008 1020   HCT 36.2 07/10/2008 1020   PLT 242 07/10/2008 1020   MCV 80.3 07/10/2008 1020   MCHC 32.2 07/10/2008 1020   RDW 15.6 (H) 07/10/2008 1020   LYMPHSABS 0.8 07/10/2008 1020   MONOABS 0.8 07/10/2008 1020   EOSABS 0.1 07/10/2008 1020   BASOSABS 0.1 07/10/2008 1020    No results found for: POCLITH, LITHIUM   No results found for: PHENYTOIN, PHENOBARB, VALPROATE, CBMZ   .res Assessment: Plan:    Plan:  Cymbalta 62m BID to target depression Paxil 447mdaily Xanax 73m56m 4 x daily - usually takes TID Vyvanse 47m67mily  Abilify 10mg22mly - take 1/2 tablet daily x 7 days, then d/c Add Latuda 20mg 74my x 7 days, then increase to 40mg d42m  RTC 4 weeks  Patient advised to contact office with any questions, adverse effects, or acute worsening in signs and symptoms.  Discussed potential metabolic side effects associated with atypical antipsychotics, as well as potential risk for movement side effects. Advised pt to contact office if movement side effects occur.   Discussed potential benefits, risks, and side effects of stimulants with patient to include increased heart rate, palpitations, insomnia, increased anxiety, increased irritability, or decreased appetite.  Instructed patient to contact office if experiencing any significant tolerability issues.  Discussed potential benefits, risk, and side effects of benzodiazepines to include potential risk of tolerance and dependence, as well as  possible drowsiness.  Advised patient not to drive if experiencing drowsiness and to take lowest possible effective dose to minimize risk of dependence and tolerance.    Diagnoses and all orders for this visit:  Major depressive disorder, recurrent episode, moderate (HCC)  Attention deficit hyperactivity disorder (ADHD), combined type -     lisdexamfetamine (VYVANSE) 60 MG capsule; Take 1 capsule (60 mg total) by mouth daily. -     lisdexamfetamine (VYVANSE) 60 MG capsule; Take 1 capsule (60 mg total) by mouth every morning. -     lisdexamfetamine (VYVANSE) 60 MG capsule; Take 1 capsule (60 mg total) by mouth every morning.  Generalized anxiety disorder -     ALPRAZolam (XANAX) 1 MG tablet; take 1 tablet by mouth four times a day if needed for anxiety  Restless legs syndrome  Insomnia, unspecified type     Please see After Visit Summary for patient specific instructions.  Future Appointments  Date Time Provider DepartmEden2021  5:20 PM Zeola Brys Berdie Ogren-CP None    No orders of the defined types were placed in this encounter.   -------------------------------

## 2020-01-04 ENCOUNTER — Encounter: Payer: Self-pay | Admitting: Adult Health

## 2020-01-04 MED ORDER — LISDEXAMFETAMINE DIMESYLATE 60 MG PO CAPS: 60.0000 mg | ORAL_CAPSULE | ORAL | 0 refills | Status: DC

## 2020-01-04 MED ORDER — ALPRAZOLAM 1 MG PO TABS: ORAL_TABLET | ORAL | 2 refills | Status: DC

## 2020-01-04 MED ORDER — LISDEXAMFETAMINE DIMESYLATE 60 MG PO CAPS: 60.0000 mg | ORAL_CAPSULE | Freq: Every day | ORAL | 0 refills | Status: DC

## 2020-02-13 ENCOUNTER — Other Ambulatory Visit: Payer: Self-pay

## 2020-02-13 ENCOUNTER — Ambulatory Visit (INDEPENDENT_AMBULATORY_CARE_PROVIDER_SITE_OTHER): Payer: BC Managed Care – PPO | Admitting: Adult Health

## 2020-02-13 DIAGNOSIS — G4700 Insomnia, unspecified: Secondary | ICD-10-CM

## 2020-02-13 DIAGNOSIS — F411 Generalized anxiety disorder: Secondary | ICD-10-CM | POA: Diagnosis not present

## 2020-02-13 DIAGNOSIS — F909 Attention-deficit hyperactivity disorder, unspecified type: Secondary | ICD-10-CM

## 2020-02-13 DIAGNOSIS — F331 Major depressive disorder, recurrent, moderate: Secondary | ICD-10-CM

## 2020-02-13 DIAGNOSIS — G2581 Restless legs syndrome: Secondary | ICD-10-CM

## 2020-02-13 MED ORDER — PAROXETINE HCL 40 MG PO TABS: 40.0000 mg | ORAL_TABLET | ORAL | 5 refills | Status: DC

## 2020-02-13 MED ORDER — ROPINIROLE HCL 1 MG PO TABS: 1.0000 mg | ORAL_TABLET | Freq: Every day | ORAL | 5 refills | Status: DC

## 2020-02-13 MED ORDER — DULOXETINE HCL 60 MG PO CPEP: 60.0000 mg | ORAL_CAPSULE | Freq: Two times a day (BID) | ORAL | 5 refills | Status: DC

## 2020-02-13 MED ORDER — LATUDA 60 MG PO TABS: ORAL_TABLET | ORAL | 5 refills | Status: DC

## 2020-02-13 NOTE — Progress Notes (Signed)
Tiffany Francis [id number] [birth year] 39 y.o.  Subjective:   Patient ID:  Tiffany Francis is a 39 y.o. (DOB 02-27-1981) female.  Chief Complaint: No chief complaint on file.   HPI [name] presents to the office today for follow-up of ADHD, anxiety, depression, insomnia.  Describes mood today as "a little better, but nothing significant". Pleasant. Flat. Mood symptoms - reports depression, anxiety, and irritability. Stating "I can tell a slight difference with switching to [place]". Would like to increase the dose to see if it will be more helpful. Still having negative thoughts. Is willing to see a therapist. Decreased interest and motivation. Taking medications as prescribed.  Energy levels vary. Active, does not have a regular exercise routine.  Enjoys some usual interests and activities. Married. Spending time with family - husband and sons. Mostly staying home. Appetite adequate. Weight loss - "a few pounds".  Sleeps well most nights. Averages 6 to 8 hours. Focus and concentration difficulties. Completing tasks. Managing some aspects of household. Work going well.Works full time for [employer]. Denies SI or HI. Denies AH or VH.  Review of Systems:  Review of Systems  Musculoskeletal: Negative for gait problem.  Neurological: Negative for tremors.  Psychiatric/Behavioral:       Please refer to HPI    Medications: I have reviewed the patient's current medications.  Current Outpatient Medications  Medication Sig Dispense Refill  . ALPRAZolam (XANAX) 1 MG tablet take 1 tablet by mouth four times a day if needed for anxiety 120 tablet 2  . DULoxetine (CYMBALTA) 60 MG capsule Take 1 capsule (60 mg total) by mouth 2 (two) times daily. 60 capsule 5  . lisdexamfetamine (VYVANSE) 60 MG capsule Take 1 capsule (60 mg total) by mouth daily. 30 capsule 0  . lisdexamfetamine (VYVANSE) 60 MG capsule Take 1 capsule (60 mg total) by mouth every morning. 30 capsule 0  . [START  ON 02/29/2020] lisdexamfetamine (VYVANSE) 60 MG capsule Take 1 capsule (60 mg total) by mouth every morning. 30 capsule 0  . Lurasidone HCl (LATUDA) 60 MG TABS Take one tablet at dinner with 350 calories. 30 tablet 5  . meloxicam (MOBIC) 15 MG tablet     . omeprazole (PRILOSEC) 40 MG capsule     . PARoxetine (PAXIL) 40 MG tablet Take 1 tablet (40 mg total) by mouth every morning. 30 tablet 5  . rOPINIRole (REQUIP) 1 MG tablet Take 1 tablet (1 mg total) by mouth at bedtime. 30 tablet 5  . tiZANidine (ZANAFLEX) 4 MG tablet TK 1 TO 1 AND 1/2 TS PO UP TO TID PRN     No current facility-administered medications for this visit.    Medication Side Effects: None  Allergies: No Known Allergies  No past medical history on file.  No family history on file.  Social History   Socioeconomic History  . Marital status: Married    Spouse name: Not on file  . Number of children: Not on file  . Years of education: Not on file  . Highest education level: Not on file  Occupational History  . Not on file  Tobacco Use  . Smoking status: Never Smoker  . Smokeless tobacco: Never Used  Substance and Sexual Activity  . Alcohol use: Not on file  . Drug use: Not on file  . Sexual activity: Not on file  Other Topics Concern  . Not on file  Social History Narrative  . Not on file   Social Determinants of Health  Financial Resource Strain:   . Difficulty of Paying Living Expenses:   Food Insecurity:   . Worried About Charity fundraiser in the Last Year:   . Arboriculturist in the Last Year:   Transportation Needs:   . Film/video editor (Medical):   Marland Kitchen Lack of Transportation (Non-Medical):   Physical Activity:   . Days of Exercise per Week:   . Minutes of Exercise per Session:   Stress:   . Feeling of Stress :   Social Connections:   . Frequency of Communication with Friends and Family:   . Frequency of Social Gatherings with Friends and Family:   . Attends Religious Services:   .  Active Member of Clubs or Organizations:   . Attends Archivist Meetings:   Marland Kitchen Marital Status:   Intimate Partner Violence:   . Fear of Current or Ex-Partner:   . Emotionally Abused:   Marland Kitchen Physically Abused:   . Sexually Abused:     Past Medical History, Surgical history, Social history, and Family history were reviewed and updated as appropriate.   Please see review of systems for further details on the patient's review from today.   Objective:   Physical Exam:  There were no vitals taken for this visit.  Physical Exam Constitutional:      General: She is not in acute distress. Musculoskeletal:        General: No deformity.  Neurological:     Mental Status: She is alert and oriented to person, place, and time.     Coordination: Coordination normal.  Psychiatric:        Attention and Perception: Attention and perception normal. She does not perceive auditory or visual hallucinations.        Mood and Affect: Mood is anxious and depressed. Affect is not labile, blunt, angry or inappropriate.        Speech: Speech normal.        Thought Content: Thought content normal. Thought content is not paranoid or delusional. Thought content does not include homicidal or suicidal ideation. Thought content does not include homicidal or suicidal plan.        Cognition and Memory: Cognition and memory normal.        Judgment: Judgment normal.     Comments: Insight intact     Lab Review:     Component Value Date/Time   NA 138 07/10/2008 1020   K 4.1 07/10/2008 1020   CL 107 07/10/2008 1020   CO2 24 07/10/2008 1020   GLUCOSE 90 07/10/2008 1020   BUN 11 07/10/2008 1020   CREATININE 0.66 07/10/2008 1020   CALCIUM 8.7 07/10/2008 1020   PROT 6.7 07/10/2008 1020   ALBUMIN 3.8 07/10/2008 1020   AST 26 07/10/2008 1020   ALT 29 07/10/2008 1020   ALKPHOS 97 07/10/2008 1020   BILITOT 0.6 07/10/2008 1020   GFRNONAA >60 07/10/2008 1020   GFRAA  07/10/2008 1020    >60        The eGFR  has been calculated using the MDRD equation. This calculation has not been validated in all clinical       Component Value Date/Time   WBC 10.3 07/10/2008 1020   RBC 4.51 07/10/2008 1020   HGB 11.7 (L) 07/10/2008 1020   HCT 36.2 07/10/2008 1020   PLT 242 07/10/2008 1020   MCV 80.3 07/10/2008 1020   MCHC 32.2 07/10/2008 1020   RDW 15.6 (H) 07/10/2008 1020   LYMPHSABS 0.8  07/10/2008 1020   MONOABS 0.8 07/10/2008 1020   EOSABS 0.1 07/10/2008 1020   BASOSABS 0.1 07/10/2008 1020    No results found for: POCLITH, LITHIUM   No results found for: PHENYTOIN, PHENOBARB, VALPROATE, CBMZ   .res Assessment: Plan:    Plan:  Cymbalta 724m BID to target depression Paxil 475mdaily Xanax 24m35m 4 x daily - usually takes TID Vyvanse 14m37mily  Increase Latuda 40mg34m14mg 24my - samples given. Script sent.   RTC 4 weeks  Refer to therapy.  Patient advised to contact office with any questions, adverse effects, or acute worsening in signs and symptoms.  Discussed potential metabolic side effects associated with atypical antipsychotics, as well as potential risk for movement side effects. Advised pt to contact office if movement side effects occur.   Discussed potential benefits, risks, and side effects of stimulants with patient to include increased heart rate, palpitations, insomnia, increased anxiety, increased irritability, or decreased appetite.  Instructed patient to contact office if experiencing any significant tolerability issues.  Discussed potential benefits, risk, and side effects of benzodiazepines to include potential risk of tolerance and dependence, as well as possible drowsiness.  Advised patient not to drive if experiencing drowsiness and to take lowest possible effective dose to minimize risk of dependence and tolerance.   Diagnoses and all orders for this visit:  Attention deficit hyperactivity disorder (ADHD), unspecified ADHD type  Generalized anxiety  disorder -     DULoxetine (CYMBALTA) 60 MG capsule; Take 1 capsule (60 mg total) by mouth 2 (two) times daily.  Major depressive disorder, recurrent episode, moderate (HCC) -     DULoxetine (CYMBALTA) 60 MG capsule; Take 1 capsule (60 mg total) by mouth 2 (two) times daily. -     PARoxetine (PAXIL) 40 MG tablet; Take 1 tablet (40 mg total) by mouth every morning. -     Lurasidone HCl (LATUDA) 60 MG TABS; Take one tablet at dinner with 350 calories.  Insomnia, unspecified type -     PARoxetine (PAXIL) 40 MG tablet; Take 1 tablet (40 mg total) by mouth every morning.  Restless legs syndrome -     rOPINIRole (REQUIP) 1 MG tablet; Take 1 tablet (1 mg total) by mouth at bedtime.     Please see After Visit Summary for patient specific instructions.  Future Appointments  Date Time Provider DepartAnthony/2021  5:20 PM Wanya Bangura, ReginaBerdie OgrenP-CP None    No orders of the defined types were placed in this encounter.   -------------------------------

## 2020-02-14 ENCOUNTER — Encounter: Payer: Self-pay | Admitting: Adult Health

## 2020-02-15 ENCOUNTER — Telehealth: Payer: Self-pay

## 2020-02-15 NOTE — Telephone Encounter (Signed)
Prior authorization submitted through cover my meds for LATUDA 60 MG approved with [employer] effective 02/15/2020-02/15/2023   Patient uses [address] [place], [place]

## 2020-03-12 ENCOUNTER — Encounter: Payer: Self-pay | Admitting: Adult Health

## 2020-03-12 ENCOUNTER — Other Ambulatory Visit: Payer: Self-pay

## 2020-03-12 ENCOUNTER — Ambulatory Visit (INDEPENDENT_AMBULATORY_CARE_PROVIDER_SITE_OTHER): Payer: BC Managed Care – PPO | Admitting: Adult Health

## 2020-03-12 DIAGNOSIS — F411 Generalized anxiety disorder: Secondary | ICD-10-CM

## 2020-03-12 DIAGNOSIS — F331 Major depressive disorder, recurrent, moderate: Secondary | ICD-10-CM | POA: Diagnosis not present

## 2020-03-12 DIAGNOSIS — G4700 Insomnia, unspecified: Secondary | ICD-10-CM

## 2020-03-12 DIAGNOSIS — F902 Attention-deficit hyperactivity disorder, combined type: Secondary | ICD-10-CM

## 2020-03-12 MED ORDER — LISDEXAMFETAMINE DIMESYLATE 60 MG PO CAPS: 60.0000 mg | ORAL_CAPSULE | Freq: Every day | ORAL | 0 refills | Status: DC

## 2020-03-12 MED ORDER — LISDEXAMFETAMINE DIMESYLATE 60 MG PO CAPS: 60.0000 mg | ORAL_CAPSULE | ORAL | 0 refills | Status: DC

## 2020-03-12 MED ORDER — ALPRAZOLAM 1 MG PO TABS: ORAL_TABLET | ORAL | 2 refills | Status: DC

## 2020-03-12 NOTE — Progress Notes (Signed)
Tiffany Francis [id number] 1980/11/17 39 y.o.  Subjective:   Patient ID:  Tiffany Francis is a 39 y.o. (DOB 1981-02-15) female.  Chief Complaint: No chief complaint on file.   HPI Tiffany Francis presents to the office today for follow-up of ADHD, anxiety, depression, insomnia.  Describes mood today as "ok. Pleasant. Mood symptoms - reports decreased depression, anxiety, and irritability. Stating "I am feeling better". Has stopped taking the Paxil 71m - 4 weeks ago - denies withdrawal or side effects. Tolerating increase in Latuda from 452mto 6070maily. Having some family "stressors". Woulld like to continue current medications for now. Plans to contact therapist - Monique Crutchfield. Improved interest and motivation. Taking medications as prescribed.  Energy levels "still not where they need to be".  Active, does not have a regular exercise routine.  Enjoys some usual interests and activities. Married. Spending time with family - husband and sons. Mostly staying home. Appetite adequate, but decreased. Weight loss. Sleeps well most nights. Averages 6 to 8 hours. Focus and concentration improved. Completing tasks. Managing some aspects of household. Work going well. Works full time for [employer] SI or HI. Denies AH or VH.   Review of Systems:  Review of Systems  Musculoskeletal: Negative for gait problem.  Neurological: Negative for tremors.  Psychiatric/Behavioral:       Please refer to HPI    Medications: I have reviewed the patient's current medications.  Current Outpatient Medications  Medication Sig Dispense Refill  . ALPRAZolam (XANAX) 1 MG tablet take 1 tablet by mouth four times a day if needed for anxiety 120 tablet 2  . DULoxetine (CYMBALTA) 60 MG capsule Take 1 capsule (60 mg total) by mouth 2 (two) times daily. 60 capsule 5  . lisdexamfetamine (VYVANSE) 60 MG capsule Take 1 capsule (60 mg total) by mouth daily. 30 capsule 0  . [START ON 04/09/2020]  lisdexamfetamine (VYVANSE) 60 MG capsule Take 1 capsule (60 mg total) by mouth every morning. 30 capsule 0  . [START ON 05/07/2020] lisdexamfetamine (VYVANSE) 60 MG capsule Take 1 capsule (60 mg total) by mouth every morning. 30 capsule 0  . Lurasidone HCl (LATUDA) 60 MG TABS Take one tablet at dinner with 350 calories. 30 tablet 5  . meloxicam (MOBIC) 15 MG tablet     . omeprazole (PRILOSEC) 40 MG capsule     . rOPINIRole (REQUIP) 1 MG tablet Take 1 tablet (1 mg total) by mouth at bedtime. 30 tablet 5  . tiZANidine (ZANAFLEX) 4 MG tablet TK 1 TO 1 AND 1/2 TS PO UP TO TID PRN     No current facility-administered medications for this visit.    Medication Side Effects: None  Allergies: No Known Allergies  No past medical history on file.  No family history on file.  Social History   Socioeconomic History  . Marital status: Married    Spouse name: Not on file  . Number of children: Not on file  . Years of education: Not on file  . Highest education level: Not on file  Occupational History  . Not on file  Tobacco Use  . Smoking status: Never Smoker  . Smokeless tobacco: Never Used  Substance and Sexual Activity  . Alcohol use: Not on file  . Drug use: Not on file  . Sexual activity: Not on file  Other Topics Concern  . Not on file  Social History Narrative  . Not on file   Social Determinants of Health   Financial  Resource Strain:   . Difficulty of Paying Living Expenses:   Food Insecurity:   . Worried About Charity fundraiser in the Last Year:   . Arboriculturist in the Last Year:   Transportation Needs:   . Film/video editor (Medical):   Marland Kitchen Lack of Transportation (Non-Medical):   Physical Activity:   . Days of Exercise per Week:   . Minutes of Exercise per Session:   Stress:   . Feeling of Stress :   Social Connections:   . Frequency of Communication with Friends and Family:   . Frequency of Social Gatherings with Friends and Family:   . Attends Religious  Services:   . Active Member of Clubs or Organizations:   . Attends Archivist Meetings:   Marland Kitchen Marital Status:   Intimate Partner Violence:   . Fear of Current or Ex-Partner:   . Emotionally Abused:   Marland Kitchen Physically Abused:   . Sexually Abused:     Past Medical History, Surgical history, Social history, and Family history were reviewed and updated as appropriate.   Please see review of systems for further details on the patient's review from today.   Objective:   Physical Exam:  There were no vitals taken for this visit.  Physical Exam  Lab Review:     Component Value Date/Time   NA 138 07/10/2008 1020   K 4.1 07/10/2008 1020   CL 107 07/10/2008 1020   CO2 24 07/10/2008 1020   GLUCOSE 90 07/10/2008 1020   BUN 11 07/10/2008 1020   CREATININE 0.66 07/10/2008 1020   CALCIUM 8.7 07/10/2008 1020   PROT 6.7 07/10/2008 1020   ALBUMIN 3.8 07/10/2008 1020   AST 26 07/10/2008 1020   ALT 29 07/10/2008 1020   ALKPHOS 97 07/10/2008 1020   BILITOT 0.6 07/10/2008 1020   GFRNONAA >60 07/10/2008 1020   GFRAA  07/10/2008 1020    >60        The eGFR has been calculated using the MDRD equation. This calculation has not been validated in all clinical       Component Value Date/Time   WBC 10.3 07/10/2008 1020   RBC 4.51 07/10/2008 1020   HGB 11.7 (L) 07/10/2008 1020   HCT 36.2 07/10/2008 1020   PLT 242 07/10/2008 1020   MCV 80.3 07/10/2008 1020   MCHC 32.2 07/10/2008 1020   RDW 15.6 (H) 07/10/2008 1020   LYMPHSABS 0.8 07/10/2008 1020   MONOABS 0.8 07/10/2008 1020   EOSABS 0.1 07/10/2008 1020   BASOSABS 0.1 07/10/2008 1020    No results found for: POCLITH, LITHIUM   No results found for: PHENYTOIN, PHENOBARB, VALPROATE, CBMZ   .res Assessment: Plan:    Plan:  Cymbalta 59m BID to target depression D/C Paxil 468mdaily - quit a month ago Xanax 3m94m 4 x daily - usually takes TID Vyvanse 56m72mily  Latuda 56mg40mly - samples given. Script sent.   RTC 8  weeks  Refer to therapy - Monique Crutchfield  Patient advised to contact office with any questions, adverse effects, or acute worsening in signs and symptoms.  Discussed potential metabolic side effects associated with atypical antipsychotics, as well as potential risk for movement side effects. Advised pt to contact office if movement side effects occur.   Discussed potential benefits, risks, and side effects of stimulants with patient to include increased heart rate, palpitations, insomnia, increased anxiety, increased irritability, or decreased appetite.  Instructed patient to contact office  if experiencing any significant tolerability issues.  Discussed potential benefits, risk, and side effects of benzodiazepines to include potential risk of tolerance and dependence, as well as possible drowsiness.  Advised patient not to drive if experiencing drowsiness and to take lowest possible effective dose to minimize risk of dependence and tolerance.     Diagnoses and all orders for this visit:  Major depressive disorder, recurrent episode, moderate (HCC)  Attention deficit hyperactivity disorder (ADHD), combined type -     lisdexamfetamine (VYVANSE) 60 MG capsule; Take 1 capsule (60 mg total) by mouth daily. -     lisdexamfetamine (VYVANSE) 60 MG capsule; Take 1 capsule (60 mg total) by mouth every morning. -     lisdexamfetamine (VYVANSE) 60 MG capsule; Take 1 capsule (60 mg total) by mouth every morning.  Generalized anxiety disorder -     ALPRAZolam (XANAX) 1 MG tablet; take 1 tablet by mouth four times a day if needed for anxiety  Insomnia, unspecified type     Please see After Visit Summary for patient specific instructions.  Future Appointments  Date Time Provider [place]  05/14/2020  5:20 PM Abbas Beyene, Berdie Ogren, NP [facility] None    No orders of the defined types were placed in this encounter.   -------------------------------

## 2020-05-14 ENCOUNTER — Other Ambulatory Visit: Payer: Self-pay

## 2020-05-14 ENCOUNTER — Ambulatory Visit (INDEPENDENT_AMBULATORY_CARE_PROVIDER_SITE_OTHER): Payer: BC Managed Care – PPO | Admitting: Adult Health

## 2020-05-14 ENCOUNTER — Encounter: Payer: Self-pay | Admitting: Adult Health

## 2020-05-14 DIAGNOSIS — F902 Attention-deficit hyperactivity disorder, combined type: Secondary | ICD-10-CM | POA: Diagnosis not present

## 2020-05-14 DIAGNOSIS — F411 Generalized anxiety disorder: Secondary | ICD-10-CM | POA: Diagnosis not present

## 2020-05-14 MED ORDER — LISDEXAMFETAMINE DIMESYLATE 60 MG PO CAPS: 60.0000 mg | ORAL_CAPSULE | ORAL | 0 refills | Status: DC

## 2020-05-14 MED ORDER — ALPRAZOLAM 1 MG PO TABS: ORAL_TABLET | ORAL | 2 refills | Status: DC

## 2020-05-14 MED ORDER — LISDEXAMFETAMINE DIMESYLATE 60 MG PO CAPS: 60.0000 mg | ORAL_CAPSULE | Freq: Every day | ORAL | 0 refills | Status: DC

## 2020-05-14 NOTE — Progress Notes (Signed)
Tiffany Francis [id number] [birth year] 39 y.o.  Subjective:   Patient ID:  Tiffany Francis is a 39 y.o. (DOB 21-Jan-1981) female.  Chief Complaint: No chief complaint on file.   HPI Mahum Betten presents to the office today for follow-up of ADHD, anxiety, depression, insomnia.  Describes mood today as "ok". Pleasant. Mood symptoms - reports decreased depression, anxiety, and irritability. Stating "I am feeling better". Has started seeing therapist - Monique Crutchfield. Improved interest and motivation. Taking medications as prescribed.  Energy levels lower. Active, does not have a regular exercise routine.  Enjoys some usual interests and activities. Married. Spending time with family - husband and sons. Mostly staying home. Appetite adequate. Weight loss. Sleeps well most nights. Averages 6 to 8 hours. Focus and concentration improved. Completing tasks. Managing some aspects of household. Work going well. Works full time for [employer]. Denies SI or HI. Denies AH or VH.  Review of Systems:  Review of Systems  Musculoskeletal: Negative for gait problem.  Neurological: Negative for tremors.  Psychiatric/Behavioral:       Please refer to HPI    Medications: I have reviewed the patient's current medications.  Current Outpatient Medications  Medication Sig Dispense Refill  . ALPRAZolam (XANAX) 1 MG tablet take 1 tablet by mouth four times a day if needed for anxiety 120 tablet 2  . DULoxetine (CYMBALTA) 60 MG capsule Take 1 capsule (60 mg total) by mouth 2 (two) times daily. 60 capsule 5  . lisdexamfetamine (VYVANSE) 60 MG capsule Take 1 capsule (60 mg total) by mouth daily. 30 capsule 0  . lisdexamfetamine (VYVANSE) 60 MG capsule Take 1 capsule (60 mg total) by mouth every morning. 30 capsule 0  . lisdexamfetamine (VYVANSE) 60 MG capsule Take 1 capsule (60 mg total) by mouth every morning. 30 capsule 0  . Lurasidone HCl (LATUDA) 60 MG TABS Take one tablet at dinner with  350 calories. 30 tablet 5  . meloxicam (MOBIC) 15 MG tablet     . omeprazole (PRILOSEC) 40 MG capsule     . rOPINIRole (REQUIP) 1 MG tablet Take 1 tablet (1 mg total) by mouth at bedtime. 30 tablet 5  . tiZANidine (ZANAFLEX) 4 MG tablet TK 1 TO 1 AND 1/2 TS PO UP TO TID PRN     No current facility-administered medications for this visit.    Medication Side Effects: None  Allergies: No Known Allergies  No past medical history on file.  No family history on file.  Social History   Socioeconomic History  . Marital status: Married    Spouse name: Not on file  . Number of children: Not on file  . Years of education: Not on file  . Highest education level: Not on file  Occupational History  . Not on file  Tobacco Use  . Smoking status: Never Smoker  . Smokeless tobacco: Never Used  Substance and Sexual Activity  . Alcohol use: Not on file  . Drug use: Not on file  . Sexual activity: Not on file  Other Topics Concern  . Not on file  Social History Narrative  . Not on file   Social Determinants of Health   Financial Resource Strain:   . Difficulty of Paying Living Expenses: Not on file  Food Insecurity:   . Worried About Charity fundraiser in the Last Year: Not on file  . Ran Out of Food in the Last Year: Not on file  Transportation Needs:   . Lack of  Transportation (Medical): Not on file  . Lack of Transportation (Non-Medical): Not on file  Physical Activity:   . Days of Exercise per Week: Not on file  . Minutes of Exercise per Session: Not on file  Stress:   . Feeling of Stress : Not on file  Social Connections:   . Frequency of Communication with Friends and Family: Not on file  . Frequency of Social Gatherings with Friends and Family: Not on file  . Attends Religious Services: Not on file  . Active Member of Clubs or Organizations: Not on file  . Attends Archivist Meetings: Not on file  . Marital Status: Not on file  Intimate Partner Violence:   .  Fear of Current or Ex-Partner: Not on file  . Emotionally Abused: Not on file  . Physically Abused: Not on file  . Sexually Abused: Not on file    Past Medical History, Surgical history, Social history, and Family history were reviewed and updated as appropriate.   Please see review of systems for further details on the patient's review from today.   Objective:   Physical Exam:  There were no vitals taken for this visit.  Physical Exam Constitutional:      General: She is not in acute distress. Musculoskeletal:        General: No deformity.  Neurological:     Mental Status: She is alert and oriented to person, place, and time.     Coordination: Coordination normal.  Psychiatric:        Attention and Perception: Attention and perception normal. She does not perceive auditory or visual hallucinations.        Mood and Affect: Mood normal. Mood is not anxious or depressed. Affect is not labile, blunt, angry or inappropriate.        Speech: Speech normal.        Behavior: Behavior normal.        Thought Content: Thought content normal. Thought content is not paranoid or delusional. Thought content does not include homicidal or suicidal ideation. Thought content does not include homicidal or suicidal plan.        Cognition and Memory: Cognition and memory normal.        Judgment: Judgment normal.     Comments: Insight intact     Lab Review:     Component Value Date/Time   NA 138 07/10/2008 1020   K 4.1 07/10/2008 1020   CL 107 07/10/2008 1020   CO2 24 07/10/2008 1020   GLUCOSE 90 07/10/2008 1020   BUN 11 07/10/2008 1020   CREATININE 0.66 07/10/2008 1020   CALCIUM 8.7 07/10/2008 1020   PROT 6.7 07/10/2008 1020   ALBUMIN 3.8 07/10/2008 1020   AST 26 07/10/2008 1020   ALT 29 07/10/2008 1020   ALKPHOS 97 07/10/2008 1020   BILITOT 0.6 07/10/2008 1020   GFRNONAA >60 07/10/2008 1020   GFRAA  07/10/2008 1020    >60        The eGFR has been calculated using the MDRD  equation. This calculation has not been validated in all clinical       Component Value Date/Time   WBC 10.3 07/10/2008 1020   RBC 4.51 07/10/2008 1020   HGB 11.7 (L) 07/10/2008 1020   HCT 36.2 07/10/2008 1020   PLT 242 07/10/2008 1020   MCV 80.3 07/10/2008 1020   MCHC 32.2 07/10/2008 1020   RDW 15.6 (H) 07/10/2008 1020   LYMPHSABS 0.8 07/10/2008 1020   MONOABS  0.8 07/10/2008 1020   EOSABS 0.1 07/10/2008 1020   BASOSABS 0.1 07/10/2008 1020    No results found for: POCLITH, LITHIUM   No results found for: PHENYTOIN, PHENOBARB, VALPROATE, CBMZ   .res Assessment: Plan:    Plan:  Cymbalta 77m BID to target depression Xanax 149m- 4 x daily - usually takes TID Vyvanse 6094maily  Latuda 13m49mily   RTC 3 months MoniAudrainherapist - seen twice  Patient advised to contact office with any questions, adverse effects, or acute worsening in signs and symptoms.  Discussed potential metabolic side effects associated with atypical antipsychotics, as well as potential risk for movement side effects. Advised pt to contact office if movement side effects occur.   Discussed potential benefits, risks, and side effects of stimulants with patient to include increased heart rate, palpitations, insomnia, increased anxiety, increased irritability, or decreased appetite.  Instructed patient to contact office if experiencing any significant tolerability issues.  Discussed potential benefits, risk, and side effects of benzodiazepines to include potential risk of tolerance and dependence, as well as possible drowsiness.  Advised patient not to drive if experiencing drowsiness and to take lowest possible effective dose to minimize risk of dependence and tolerance.    Diagnoses and all orders for this visit:  Generalized anxiety disorder  Attention deficit hyperactivity disorder (ADHD), combined type     Please see After Visit Summary for patient specific instructions.  No  future appointments.  No orders of the defined types were placed in this encounter.   -------------------------------

## 2020-07-23 ENCOUNTER — Other Ambulatory Visit: Payer: Self-pay | Admitting: Adult Health

## 2020-07-23 DIAGNOSIS — F331 Major depressive disorder, recurrent, moderate: Secondary | ICD-10-CM

## 2020-08-14 ENCOUNTER — Other Ambulatory Visit: Payer: Self-pay

## 2020-08-14 ENCOUNTER — Encounter: Payer: Self-pay | Admitting: Adult Health

## 2020-08-14 ENCOUNTER — Ambulatory Visit (INDEPENDENT_AMBULATORY_CARE_PROVIDER_SITE_OTHER): Payer: BC Managed Care – PPO | Admitting: Adult Health

## 2020-08-14 DIAGNOSIS — F909 Attention-deficit hyperactivity disorder, unspecified type: Secondary | ICD-10-CM

## 2020-08-14 DIAGNOSIS — F411 Generalized anxiety disorder: Secondary | ICD-10-CM

## 2020-08-14 DIAGNOSIS — G4700 Insomnia, unspecified: Secondary | ICD-10-CM | POA: Diagnosis not present

## 2020-08-14 DIAGNOSIS — F902 Attention-deficit hyperactivity disorder, combined type: Secondary | ICD-10-CM | POA: Diagnosis not present

## 2020-08-14 DIAGNOSIS — F331 Major depressive disorder, recurrent, moderate: Secondary | ICD-10-CM

## 2020-08-14 DIAGNOSIS — G2581 Restless legs syndrome: Secondary | ICD-10-CM

## 2020-08-14 MED ORDER — LATUDA 60 MG PO TABS: ORAL_TABLET | ORAL | 5 refills | Status: DC

## 2020-08-14 MED ORDER — LISDEXAMFETAMINE DIMESYLATE 60 MG PO CAPS: 60.0000 mg | ORAL_CAPSULE | Freq: Every day | ORAL | 0 refills | Status: DC

## 2020-08-14 MED ORDER — AMPHETAMINE-DEXTROAMPHETAMINE 20 MG PO TABS: 20.0000 mg | ORAL_TABLET | Freq: Every day | ORAL | 0 refills | Status: DC

## 2020-08-14 MED ORDER — DULOXETINE HCL 60 MG PO CPEP: 60.0000 mg | ORAL_CAPSULE | Freq: Two times a day (BID) | ORAL | 5 refills | Status: DC

## 2020-08-14 MED ORDER — ROPINIROLE HCL 1 MG PO TABS: 1.0000 mg | ORAL_TABLET | Freq: Every day | ORAL | 5 refills | Status: DC

## 2020-08-14 MED ORDER — ALPRAZOLAM 1 MG PO TABS: ORAL_TABLET | ORAL | 2 refills | Status: DC

## 2020-08-14 NOTE — Progress Notes (Signed)
Tiffany Francis [id number] Oct 23, 1980 40 y.o.  Subjective:   Patient ID:  Tiffany Francis is a 40 y.o. (DOB 03/22/1981) female.  Chief Complaint: No chief complaint on file.   HPI [name] presents to the office today for follow-up of ADHD, anxiety, depression, insomnia.  Describes mood today as "ok". Pleasant. Tearful at times. Mood symptoms - reports depression, anxiety, and irritability. More depressed overall. Stating "I'm not doing as good as I was". Feels "concerned". Son expecting another child - has a son 27 months old. He and girlfriend living with her mother. Husband giving her an ultimatum - if she helps son he will leave and if she doesn't he will stay. Is worried about her and her health. Son disrespectful and cussing at her. She and husband getting along better. Therapist - Monique Crutchfield - unable to see currently due to costs. Decreased interest and motivation. Taking medications as prescribed.  Energy levels lower. Active, does not have a regular exercise routine.  Enjoys some usual interests and activities. Married. Spending time with family - husband and sons. Mostly staying home. Appetite adequate. Weight loss. Sleeps well most nights. Averages 6 to 8 hours. Focus and concentration improved. Completing tasks. Managing some aspects of household. Work going well. Works full time for [employer]. Denies SI or HI.  Denies AH or VH.   Review of Systems:  Review of Systems  Musculoskeletal: Negative for gait problem.  Neurological: Negative for tremors.  Psychiatric/Behavioral:       Please refer to HPI    Medications: I have reviewed the patient's current medications.  Current Outpatient Medications  Medication Sig Dispense Refill  . amphetamine-dextroamphetamine (ADDERALL) 20 MG tablet Take 1 tablet (20 mg total) by mouth daily. 30 tablet 0  . ALPRAZolam (XANAX) 1 MG tablet take 1 tablet by mouth four times a day if needed for anxiety 120 tablet  2  . DULoxetine (CYMBALTA) 60 MG capsule Take 1 capsule (60 mg total) by mouth 2 (two) times daily. 60 capsule 5  . lisdexamfetamine (VYVANSE) 60 MG capsule Take 1 capsule (60 mg total) by mouth every morning. 30 capsule 0  . lisdexamfetamine (VYVANSE) 60 MG capsule Take 1 capsule (60 mg total) by mouth every morning. 30 capsule 0  . lisdexamfetamine (VYVANSE) 60 MG capsule Take 1 capsule (60 mg total) by mouth daily. 30 capsule 0  . Lurasidone HCl (LATUDA) 60 MG TABS TAKE 1 TABLET BY MOUTH AT DINNER WITH 350 CALORIES 30 tablet 5  . meloxicam (MOBIC) 15 MG tablet     . omeprazole (PRILOSEC) 40 MG capsule     . rOPINIRole (REQUIP) 1 MG tablet Take 1 tablet (1 mg total) by mouth at bedtime. 30 tablet 5  . tiZANidine (ZANAFLEX) 4 MG tablet TK 1 TO 1 AND 1/2 TS PO UP TO TID PRN     No current facility-administered medications for this visit.    Medication Side Effects: None  Allergies: No Known Allergies  No past medical history on file.  No family history on file.  Social History   Socioeconomic History  . Marital status: Married    Spouse name: Not on file  . Number of children: Not on file  . Years of education: Not on file  . Highest education level: Not on file  Occupational History  . Not on file  Tobacco Use  . Smoking status: Never Smoker  . Smokeless tobacco: Never Used  Substance and Sexual Activity  . Alcohol use: Not  on file  . Drug use: Not on file  . Sexual activity: Not on file  Other Topics Concern  . Not on file  Social History Narrative  . Not on file   Social Determinants of Health   Financial Resource Strain: Not on file  Food Insecurity: Not on file  Transportation Needs: Not on file  Physical Activity: Not on file  Stress: Not on file  Social Connections: Not on file  Intimate Partner Violence: Not on file    Past Medical History, Surgical history, Social history, and Family history were reviewed and updated as appropriate.   Please see  review of systems for further details on the patient's review from today.   Objective:   Physical Exam:  There were no vitals taken for this visit.  Physical Exam Constitutional:      General: She is not in acute distress. Musculoskeletal:        General: No deformity.  Neurological:     Mental Status: She is alert and oriented to person, place, and time.     Coordination: Coordination normal.  Psychiatric:        Attention and Perception: Attention and perception normal. She does not perceive auditory or visual hallucinations.        Mood and Affect: Mood normal. Mood is not anxious or depressed. Affect is not labile, blunt, angry or inappropriate.        Speech: Speech normal.        Behavior: Behavior normal.        Thought Content: Thought content normal. Thought content is not paranoid or delusional. Thought content does not include homicidal or suicidal ideation. Thought content does not include homicidal or suicidal plan.        Cognition and Memory: Cognition and memory normal.        Judgment: Judgment normal.     Comments: Insight intact     Lab Review:     Component Value Date/Time   NA 138 07/10/2008 1020   K 4.1 07/10/2008 1020   CL 107 07/10/2008 1020   CO2 24 07/10/2008 1020   GLUCOSE 90 07/10/2008 1020   BUN 11 07/10/2008 1020   CREATININE 0.66 07/10/2008 1020   CALCIUM 8.7 07/10/2008 1020   PROT 6.7 07/10/2008 1020   ALBUMIN 3.8 07/10/2008 1020   AST 26 07/10/2008 1020   ALT 29 07/10/2008 1020   ALKPHOS 97 07/10/2008 1020   BILITOT 0.6 07/10/2008 1020   GFRNONAA >60 07/10/2008 1020   GFRAA  07/10/2008 1020    >60        The eGFR has been calculated using the MDRD equation. This calculation has not been validated in all clinical       Component Value Date/Time   WBC 10.3 07/10/2008 1020   RBC 4.51 07/10/2008 1020   HGB 11.7 (L) 07/10/2008 1020   HCT 36.2 07/10/2008 1020   PLT 242 07/10/2008 1020   MCV 80.3 07/10/2008 1020   MCHC 32.2  07/10/2008 1020   RDW 15.6 (H) 07/10/2008 1020   LYMPHSABS 0.8 07/10/2008 1020   MONOABS 0.8 07/10/2008 1020   EOSABS 0.1 07/10/2008 1020   BASOSABS 0.1 07/10/2008 1020    No results found for: POCLITH, LITHIUM   No results found for: PHENYTOIN, PHENOBARB, VALPROATE, CBMZ   .res Assessment: Plan:    Plan:  Cymbalta 10m BID to target depression Xanax 146m- 4 x daily - usually takes TID Increase Vyvanse 6080mo 23m34mily  Restart Adderall 69m in the afternoon. Latuda 613mdaily  Requip 12m73mID  RTC 3 months  Monique Crutchfield - therapist.  Patient advised to contact office with any questions, adverse effects, or acute worsening in signs and symptoms.  Discussed potential metabolic side effects associated with atypical antipsychotics, as well as potential risk for movement side effects. Advised pt to contact office if movement side effects occur.   Discussed potential benefits, risks, and side effects of stimulants with patient to include increased heart rate, palpitations, insomnia, increased anxiety, increased irritability, or decreased appetite.  Instructed patient to contact office if experiencing any significant tolerability issues.  Discussed potential benefits, risk, and side effects of benzodiazepines to include potential risk of tolerance and dependence, as well as possible drowsiness.  Advised patient not to drive if experiencing drowsiness and to take lowest possible effective dose to minimize risk of dependence and tolerance.   Diagnoses and all orders for this visit:  Insomnia, unspecified type  Attention deficit hyperactivity disorder (ADHD), combined type -     lisdexamfetamine (VYVANSE) 60 MG capsule; Take 1 capsule (60 mg total) by mouth daily. -     amphetamine-dextroamphetamine (ADDERALL) 20 MG tablet; Take 1 tablet (20 mg total) by mouth daily.  Generalized anxiety disorder -     ALPRAZolam (XANAX) 1 MG tablet; take 1 tablet by mouth four times a  day if needed for anxiety -     DULoxetine (CYMBALTA) 60 MG capsule; Take 1 capsule (60 mg total) by mouth 2 (two) times daily.  Major depressive disorder, recurrent episode, moderate (HCC) -     DULoxetine (CYMBALTA) 60 MG capsule; Take 1 capsule (60 mg total) by mouth 2 (two) times daily. -     Lurasidone HCl (LATUDA) 60 MG TABS; TAKE 1 TABLET BY MOUTH AT DINNER WITH 350 CALORIES  Attention deficit hyperactivity disorder (ADHD), unspecified ADHD type  Restless legs syndrome -     rOPINIRole (REQUIP) 1 MG tablet; Take 1 tablet (1 mg total) by mouth at bedtime.     Please see After Visit Summary for patient specific instructions.  No future appointments.  No orders of the defined types were placed in this encounter.   -------------------------------

## 2020-08-15 ENCOUNTER — Telehealth: Payer: Self-pay | Admitting: Adult Health

## 2020-08-15 NOTE — Telephone Encounter (Signed)
Pt had apt 1/12. Plan was to increase Vyvanse to 70 mg daily and start Adderall 20 mg. Rx was sent to pharmacy for 60 mg (previous dose). Pt did not pick up 60 mg. Pt needs new Rx sent for Vyvanse 70 mg [facility] [place] on file. Pt contact # work [phone].

## 2020-08-16 ENCOUNTER — Telehealth: Payer: Self-pay | Admitting: Adult Health

## 2020-08-16 ENCOUNTER — Telehealth: Payer: Self-pay

## 2020-08-16 ENCOUNTER — Other Ambulatory Visit: Payer: Self-pay | Admitting: Physician Assistant

## 2020-08-16 MED ORDER — LISDEXAMFETAMINE DIMESYLATE 70 MG PO CAPS: 70.0000 mg | ORAL_CAPSULE | Freq: Every day | ORAL | 0 refills | Status: DC

## 2020-08-16 NOTE — Telephone Encounter (Signed)
Can you review this? Tiffany Francis was suppose to send Vyvanse 70 mg on 01/12 after visit but the 60 mg is still on file.  Can you send the Vyvanse 70 mg? It is noted she was increasing her dose.

## 2020-08-16 NOTE — Telephone Encounter (Signed)
I sent in 1 Rx for the 70mg .  I didn't d/c the 60 mg ones, in case wants to change back.

## 2020-08-16 NOTE — Telephone Encounter (Signed)
I received a PA for the Vyvanse 70 mg not on Adderall. Will submit today

## 2020-08-16 NOTE — Telephone Encounter (Signed)
Per pt. Adderall 20 mg is needing a PA at the pharmacy.

## 2020-08-16 NOTE — Telephone Encounter (Signed)
Prior approval received for VYVANSE 70 MG effective 08/16/2020-08/17/2023.  Request for Amphetamine-dextroamphetamine 20 mg #30 PA submitted.

## 2020-08-16 NOTE — Telephone Encounter (Signed)
Next appt is 09/17/20. Requesting Vyvanse 70 mg called to [facility], Store [phone]. Phone # is [phone]

## 2020-08-18 NOTE — Telephone Encounter (Signed)
Script sent 08/17/2019 - [facility].

## 2020-08-19 NOTE — Telephone Encounter (Signed)
Prior approval received for AMPHETAMINE-DEXTROAMPHETAMINE 20 MG effective 08/19/2020-08/20/2023 with [facility]

## 2020-09-17 ENCOUNTER — Ambulatory Visit (INDEPENDENT_AMBULATORY_CARE_PROVIDER_SITE_OTHER): Payer: BC Managed Care – PPO | Admitting: Adult Health

## 2020-09-17 ENCOUNTER — Other Ambulatory Visit: Payer: Self-pay

## 2020-09-17 ENCOUNTER — Encounter: Payer: Self-pay | Admitting: Adult Health

## 2020-09-17 DIAGNOSIS — F331 Major depressive disorder, recurrent, moderate: Secondary | ICD-10-CM | POA: Diagnosis not present

## 2020-09-17 DIAGNOSIS — G4700 Insomnia, unspecified: Secondary | ICD-10-CM

## 2020-09-17 DIAGNOSIS — G2581 Restless legs syndrome: Secondary | ICD-10-CM

## 2020-09-17 DIAGNOSIS — F902 Attention-deficit hyperactivity disorder, combined type: Secondary | ICD-10-CM | POA: Diagnosis not present

## 2020-09-17 DIAGNOSIS — F411 Generalized anxiety disorder: Secondary | ICD-10-CM | POA: Diagnosis not present

## 2020-09-17 DIAGNOSIS — F909 Attention-deficit hyperactivity disorder, unspecified type: Secondary | ICD-10-CM

## 2020-09-17 MED ORDER — AMPHETAMINE-DEXTROAMPHETAMINE 20 MG PO TABS: 20.0000 mg | ORAL_TABLET | Freq: Every day | ORAL | 0 refills | Status: DC

## 2020-09-17 MED ORDER — LISDEXAMFETAMINE DIMESYLATE 70 MG PO CAPS: 70.0000 mg | ORAL_CAPSULE | Freq: Every day | ORAL | 0 refills | Status: DC

## 2020-09-17 MED ORDER — LISDEXAMFETAMINE DIMESYLATE 70 MG PO CAPS: 70.0000 mg | ORAL_CAPSULE | ORAL | 0 refills | Status: DC

## 2020-09-17 NOTE — Progress Notes (Signed)
Tiffany Francis [id number] 12/12/1980 40 y.o.  Subjective:   Patient ID:  Tiffany Francis is a 40 y.o. (DOB October 18, 1980) female.  Chief Complaint: No chief complaint on file.   HPI Tiffany Francis presents to the office today for follow-up of ADHD, anxiety, depression, insomnia.  Describes mood today as "ok". Pleasant. Tearful at times. Mood symptoms - reports decreased depression, anxiety, and irritability. Having "good and bad" days. Stating "it just depends on the day". She and son not getting along well - worried she may not get to see her grandson. Still not wanting to do things around the house. Struggles to shower - bathing once or twice a week. Stating "it's such a hard task". She and husband getting along better. Decreased interest and motivation. Taking medications as prescribed.  Energy levels improved. Active, does not have a regular exercise routine.  Enjoys some usual interests and activities. Married. Spending time with family - husband and sons. Mostly staying home. Appetite adequate. Weight loss - cutting back on portion size. Sleeps well most nights. Averages 6 to 8 hours. Focus and concentration improved. Completing tasks. Managing some aspects of household. Work going well. Works full time for [employer]. Denies SI or HI.  Denies AH or VH.  Review of Systems:  Review of Systems  Musculoskeletal: Negative for gait problem.  Neurological: Negative for tremors.  Psychiatric/Behavioral:       Please refer to HPI    Medications: I have reviewed the patient's current medications.  Current Outpatient Medications  Medication Sig Dispense Refill  . ALPRAZolam (XANAX) 1 MG tablet take 1 tablet by mouth four times a day if needed for anxiety 120 tablet 2  . amphetamine-dextroamphetamine (ADDERALL) 20 MG tablet Take 1 tablet (20 mg total) by mouth daily. 30 tablet 0  . DULoxetine (CYMBALTA) 60 MG capsule Take 1 capsule (60 mg total) by mouth 2 (two) times daily. 60  capsule 5  . lisdexamfetamine (VYVANSE) 60 MG capsule Take 1 capsule (60 mg total) by mouth every morning. 30 capsule 0  . lisdexamfetamine (VYVANSE) 60 MG capsule Take 1 capsule (60 mg total) by mouth every morning. 30 capsule 0  . lisdexamfetamine (VYVANSE) 60 MG capsule Take 1 capsule (60 mg total) by mouth daily. 30 capsule 0  . lisdexamfetamine (VYVANSE) 70 MG capsule Take 1 capsule (70 mg total) by mouth daily. 30 capsule 0  . Lurasidone HCl (LATUDA) 60 MG TABS TAKE 1 TABLET BY MOUTH AT DINNER WITH 350 CALORIES 30 tablet 5  . meloxicam (MOBIC) 15 MG tablet     . omeprazole (PRILOSEC) 40 MG capsule     . rOPINIRole (REQUIP) 1 MG tablet Take 1 tablet (1 mg total) by mouth at bedtime. 30 tablet 5  . tiZANidine (ZANAFLEX) 4 MG tablet TK 1 TO 1 AND 1/2 TS PO UP TO TID PRN     No current facility-administered medications for this visit.    Medication Side Effects: None  Allergies: No Known Allergies  No past medical history on file.  No family history on file.  Social History   Socioeconomic History  . Marital status: Married    Spouse name: Not on file  . Number of children: Not on file  . Years of education: Not on file  . Highest education level: Not on file  Occupational History  . Not on file  Tobacco Use  . Smoking status: Never Smoker  . Smokeless tobacco: Never Used  Substance and Sexual Activity  . Alcohol  use: Not on file  . Drug use: Not on file  . Sexual activity: Not on file  Other Topics Concern  . Not on file  Social History Narrative  . Not on file   Social Determinants of Health   Financial Resource Strain: Not on file  Food Insecurity: Not on file  Transportation Needs: Not on file  Physical Activity: Not on file  Stress: Not on file  Social Connections: Not on file  Intimate Partner Violence: Not on file    Past Medical History, Surgical history, Social history, and Family history were reviewed and updated as appropriate.   Please see review  of systems for further details on the patient's review from today.   Objective:   Physical Exam:  There were no vitals taken for this visit.  Physical Exam Constitutional:      General: She is not in acute distress. Musculoskeletal:        General: No deformity.  Neurological:     Mental Status: She is alert and oriented to person, place, and time.     Coordination: Coordination normal.  Psychiatric:        Attention and Perception: Attention and perception normal. She does not perceive auditory or visual hallucinations.        Mood and Affect: Mood normal. Mood is not anxious or depressed. Affect is not labile, blunt, angry or inappropriate.        Speech: Speech normal.        Behavior: Behavior normal.        Thought Content: Thought content normal. Thought content is not paranoid or delusional. Thought content does not include homicidal or suicidal ideation. Thought content does not include homicidal or suicidal plan.        Cognition and Memory: Cognition and memory normal.        Judgment: Judgment normal.     Comments: Insight intact     Lab Review:     Component Value Date/Time   NA 138 07/10/2008 1020   K 4.1 07/10/2008 1020   CL 107 07/10/2008 1020   CO2 24 07/10/2008 1020   GLUCOSE 90 07/10/2008 1020   BUN 11 07/10/2008 1020   CREATININE 0.66 07/10/2008 1020   CALCIUM 8.7 07/10/2008 1020   PROT 6.7 07/10/2008 1020   ALBUMIN 3.8 07/10/2008 1020   AST 26 07/10/2008 1020   ALT 29 07/10/2008 1020   ALKPHOS 97 07/10/2008 1020   BILITOT 0.6 07/10/2008 1020   GFRNONAA >60 07/10/2008 1020   GFRAA  07/10/2008 1020    >60        The eGFR has been calculated using the MDRD equation. This calculation has not been validated in all clinical       Component Value Date/Time   WBC 10.3 07/10/2008 1020   RBC 4.51 07/10/2008 1020   HGB 11.7 (L) 07/10/2008 1020   HCT 36.2 07/10/2008 1020   PLT 242 07/10/2008 1020   MCV 80.3 07/10/2008 1020   MCHC 32.2 07/10/2008  1020   RDW 15.6 (H) 07/10/2008 1020   LYMPHSABS 0.8 07/10/2008 1020   MONOABS 0.8 07/10/2008 1020   EOSABS 0.1 07/10/2008 1020   BASOSABS 0.1 07/10/2008 1020    No results found for: POCLITH, LITHIUM   No results found for: PHENYTOIN, PHENOBARB, VALPROATE, CBMZ   .res Assessment: Plan:    Plan:  Cymbalta 46m BID to target depression Xanax 172m- 4 x daily - usually takes TID Vyvanse 7069maily  Adderall  3m in the afternoon. Latuda 629mdaily  Requip 60m8mID  RTC 3 months  Monique Crutchfield - therapist.  Patient advised to contact office with any questions, adverse effects, or acute worsening in signs and symptoms.  Discussed potential metabolic side effects associated with atypical antipsychotics, as well as potential risk for movement side effects. Advised pt to contact office if movement side effects occur.   Discussed potential benefits, risks, and side effects of stimulants with patient to include increased heart rate, palpitations, insomnia, increased anxiety, increased irritability, or decreased appetite.  Instructed patient to contact office if experiencing any significant tolerability issues.  Discussed potential benefits, risk, and side effects of benzodiazepines to include potential risk of tolerance and dependence, as well as possible drowsiness.  Advised patient not to drive if experiencing drowsiness and to take lowest possible effective dose to minimize risk of dependence and tolerance.   Diagnoses and all orders for this visit:  Attention deficit hyperactivity disorder (ADHD), combined type  Generalized anxiety disorder  Major depressive disorder, recurrent episode, moderate (HCC)  Insomnia, unspecified type  Attention deficit hyperactivity disorder (ADHD), unspecified ADHD type  Restless legs syndrome     Please see After Visit Summary for patient specific instructions.  No future appointments.  No orders of the defined types were placed in  this encounter.   -------------------------------

## 2020-10-07 ENCOUNTER — Other Ambulatory Visit: Payer: Self-pay | Admitting: Adult Health

## 2020-10-07 ENCOUNTER — Telehealth: Payer: Self-pay | Admitting: Adult Health

## 2020-10-07 DIAGNOSIS — F902 Attention-deficit hyperactivity disorder, combined type: Secondary | ICD-10-CM

## 2020-10-07 MED ORDER — AMPHETAMINE-DEXTROAMPHETAMINE 20 MG PO TABS: 20.0000 mg | ORAL_TABLET | Freq: Every day | ORAL | 0 refills | Status: DC

## 2020-10-07 NOTE — Telephone Encounter (Signed)
Script sent  

## 2020-10-07 NOTE — Telephone Encounter (Signed)
Pt called and said that her Adderall rx that was called in at [facility] on [address] has not been filled yet because they have been out of stock for several days now. Pt would like to know if she can get a new rx sent in at [facility] on [address] in [place].

## 2020-12-02 ENCOUNTER — Other Ambulatory Visit: Payer: Self-pay

## 2020-12-02 ENCOUNTER — Ambulatory Visit (INDEPENDENT_AMBULATORY_CARE_PROVIDER_SITE_OTHER): Payer: BC Managed Care – PPO | Admitting: Adult Health

## 2020-12-02 ENCOUNTER — Encounter: Payer: Self-pay | Admitting: Adult Health

## 2020-12-02 DIAGNOSIS — F331 Major depressive disorder, recurrent, moderate: Secondary | ICD-10-CM

## 2020-12-02 DIAGNOSIS — G2581 Restless legs syndrome: Secondary | ICD-10-CM

## 2020-12-02 DIAGNOSIS — F902 Attention-deficit hyperactivity disorder, combined type: Secondary | ICD-10-CM | POA: Diagnosis not present

## 2020-12-02 DIAGNOSIS — F411 Generalized anxiety disorder: Secondary | ICD-10-CM

## 2020-12-02 DIAGNOSIS — G4700 Insomnia, unspecified: Secondary | ICD-10-CM | POA: Diagnosis not present

## 2020-12-02 NOTE — Progress Notes (Signed)
Raevin Wierenga [id number] [birth year] 40 y.o.  Subjective:   Patient ID:  Tiffany Francis is a 40 y.o. (DOB 05-04-1981) female.  Chief Complaint: No chief complaint on file.   HPI Tiffany Francis presents to the office today for follow-up of ADHD, anxiety, depression, insomnia, and RLS.  Describes mood today as "ok". Pleasant. Tearful at times. Mood symptoms - reports depression, anxiety, and irritability. Stating "I'm not sure how I feel about things". Trying to be "strong". Conflict with son - "he hates me". Son expecting a second child. Stating "I'm trying to cope with things". She and husband are "ok". Youngest son has driver's permit. Decreased interest and motivation. Taking medications as prescribed.  Energy levels vary. Active, does not have a regular exercise routine.  Enjoys some usual interests and activities. Married. Spending time with family. Mostly staying home. Appetite adequate. Weight gain. Sleeps well most nights. Averages 6 to 8 hours. Focus and concentration improved. Completing tasks. Managing some aspects of household. Works full time for [employer]. Denies SI or HI.  Denies AH or VH.  Review of Systems:  Review of Systems  Musculoskeletal: Negative for gait problem.  Neurological: Negative for tremors.  Psychiatric/Behavioral:       Please refer to HPI    Medications: I have reviewed the patient's current medications.  Current Outpatient Medications  Medication Sig Dispense Refill  . ALPRAZolam (XANAX) 1 MG tablet take 1 tablet by mouth four times a day if needed for anxiety 120 tablet 2  . amphetamine-dextroamphetamine (ADDERALL) 20 MG tablet Take 1 tablet (20 mg total) by mouth daily. 30 tablet 0  . amphetamine-dextroamphetamine (ADDERALL) 20 MG tablet Take 1 tablet (20 mg total) by mouth daily. 30 tablet 0  . amphetamine-dextroamphetamine (ADDERALL) 20 MG tablet Take 1 tablet (20 mg total) by mouth daily. 30 tablet 0  . DULoxetine (CYMBALTA)  60 MG capsule Take 1 capsule (60 mg total) by mouth 2 (two) times daily. 60 capsule 5  . lisdexamfetamine (VYVANSE) 70 MG capsule Take 1 capsule (70 mg total) by mouth daily. 30 capsule 0  . lisdexamfetamine (VYVANSE) 70 MG capsule Take 1 capsule (70 mg total) by mouth daily. 30 capsule 0  . lisdexamfetamine (VYVANSE) 70 MG capsule Take 1 capsule (70 mg total) by mouth every morning. 30 capsule 0  . Lurasidone HCl (LATUDA) 60 MG TABS TAKE 1 TABLET BY MOUTH AT DINNER WITH 350 CALORIES 30 tablet 5  . meloxicam (MOBIC) 15 MG tablet     . omeprazole (PRILOSEC) 40 MG capsule     . rOPINIRole (REQUIP) 1 MG tablet Take 1 tablet (1 mg total) by mouth at bedtime. 30 tablet 5  . tiZANidine (ZANAFLEX) 4 MG tablet TK 1 TO 1 AND 1/2 TS PO UP TO TID PRN     No current facility-administered medications for this visit.     Medication Side Effects: None  Allergies: No Known Allergies  No past medical history on file.  Past Medical History, Surgical history, Social history, and Family history were reviewed and updated as appropriate.   Please see review of systems for further details on the patient's review from today.   Objective:   Physical Exam:  There were no vitals taken for this visit.  Physical Exam Constitutional:      General: She is not in acute distress. Musculoskeletal:        General: No deformity.  Neurological:     Mental Status: She is alert and oriented to person, place,  and time.     Coordination: Coordination normal.  Psychiatric:        Attention and Perception: Attention and perception normal. She does not perceive auditory or visual hallucinations.        Mood and Affect: Mood normal. Mood is not anxious or depressed. Affect is not labile, blunt, angry or inappropriate.        Speech: Speech normal.        Behavior: Behavior normal.        Thought Content: Thought content normal. Thought content is not paranoid or delusional. Thought content does not include homicidal or  suicidal ideation. Thought content does not include homicidal or suicidal plan.        Cognition and Memory: Cognition and memory normal.        Judgment: Judgment normal.     Comments: Insight intact     Lab Review:     Component Value Date/Time   NA 138 07/10/2008 1020   K 4.1 07/10/2008 1020   CL 107 07/10/2008 1020   CO2 24 07/10/2008 1020   GLUCOSE 90 07/10/2008 1020   BUN 11 07/10/2008 1020   CREATININE 0.66 07/10/2008 1020   CALCIUM 8.7 07/10/2008 1020   PROT 6.7 07/10/2008 1020   ALBUMIN 3.8 07/10/2008 1020   AST 26 07/10/2008 1020   ALT 29 07/10/2008 1020   ALKPHOS 97 07/10/2008 1020   BILITOT 0.6 07/10/2008 1020   GFRNONAA >60 07/10/2008 1020   GFRAA  07/10/2008 1020    >60        The eGFR has been calculated using the MDRD equation. This calculation has not been validated in all clinical       Component Value Date/Time   WBC 10.3 07/10/2008 1020   RBC 4.51 07/10/2008 1020   HGB 11.7 (L) 07/10/2008 1020   HCT 36.2 07/10/2008 1020   PLT 242 07/10/2008 1020   MCV 80.3 07/10/2008 1020   MCHC 32.2 07/10/2008 1020   RDW 15.6 (H) 07/10/2008 1020   LYMPHSABS 0.8 07/10/2008 1020   MONOABS 0.8 07/10/2008 1020   EOSABS 0.1 07/10/2008 1020   BASOSABS 0.1 07/10/2008 1020    No results found for: POCLITH, LITHIUM   No results found for: PHENYTOIN, PHENOBARB, VALPROATE, CBMZ   .res Assessment: Plan:     Plan:  Cymbalta 68m BID to target depression Xanax 112m- 4 x daily - usually takes TID Vyvanse 7037maily  Adderall 59m40m the afternoon. Latuda 60mg60mly  Requip 1mg B25m Consider Phentermine  RTC 3 months  MoniquCrosbyrapist.  Patient advised to contact office with any questions, adverse effects, or acute worsening in signs and symptoms.  Discussed potential metabolic side effects associated with atypical antipsychotics, as well as potential risk for movement side effects. Advised pt to contact office if movement side effects  occur.   Discussed potential benefits, risks, and side effects of stimulants with patient to include increased heart rate, palpitations, insomnia, increased anxiety, increased irritability, or decreased appetite.  Instructed patient to contact office if experiencing any significant tolerability issues.  Discussed potential benefits, risk, and side effects of benzodiazepines to include potential risk of tolerance and dependence, as well as possible drowsiness.  Advised patient not to drive if experiencing drowsiness and to take lowest possible effective dose to minimize risk of dependence and tolerance.    Diagnoses and all orders for this visit:  Attention deficit hyperactivity disorder (ADHD), combined type  Generalized anxiety disorder  Major  depressive disorder, recurrent episode, moderate (HCC)  Insomnia, unspecified type  Restless legs syndrome     Please see After Visit Summary for patient specific instructions.  No future appointments.  No orders of the defined types were placed in this encounter.   -------------------------------

## 2020-12-28 ENCOUNTER — Other Ambulatory Visit: Payer: Self-pay | Admitting: Adult Health

## 2020-12-28 DIAGNOSIS — F411 Generalized anxiety disorder: Secondary | ICD-10-CM

## 2020-12-30 NOTE — Telephone Encounter (Signed)
Last apt 05/02, scheduled in August for f/u

## 2021-01-09 ENCOUNTER — Telehealth: Payer: Self-pay | Admitting: Adult Health

## 2021-01-09 ENCOUNTER — Other Ambulatory Visit: Payer: Self-pay

## 2021-01-09 DIAGNOSIS — F902 Attention-deficit hyperactivity disorder, combined type: Secondary | ICD-10-CM

## 2021-01-09 DIAGNOSIS — F331 Major depressive disorder, recurrent, moderate: Secondary | ICD-10-CM

## 2021-01-09 DIAGNOSIS — F411 Generalized anxiety disorder: Secondary | ICD-10-CM

## 2021-01-09 MED ORDER — LISDEXAMFETAMINE DIMESYLATE 70 MG PO CAPS: 70.0000 mg | ORAL_CAPSULE | Freq: Every day | ORAL | 0 refills | Status: DC

## 2021-01-09 MED ORDER — LISDEXAMFETAMINE DIMESYLATE 70 MG PO CAPS: 70.0000 mg | ORAL_CAPSULE | ORAL | 0 refills | Status: DC

## 2021-01-09 NOTE — Telephone Encounter (Signed)
Pt called and asked for refills on her vyvanse 70 mg to be sent to the [facility] on [address] in [place]. Next appt 8/2

## 2021-01-09 NOTE — Telephone Encounter (Signed)
Pended.

## 2021-01-26 ENCOUNTER — Other Ambulatory Visit: Payer: Self-pay | Admitting: Adult Health

## 2021-01-26 DIAGNOSIS — F411 Generalized anxiety disorder: Secondary | ICD-10-CM

## 2021-01-26 DIAGNOSIS — F331 Major depressive disorder, recurrent, moderate: Secondary | ICD-10-CM

## 2021-01-26 DIAGNOSIS — G2581 Restless legs syndrome: Secondary | ICD-10-CM

## 2021-02-04 ENCOUNTER — Ambulatory Visit (INDEPENDENT_AMBULATORY_CARE_PROVIDER_SITE_OTHER): Payer: BC Managed Care – PPO | Admitting: Adult Health

## 2021-02-04 ENCOUNTER — Encounter: Payer: Self-pay | Admitting: Adult Health

## 2021-02-04 ENCOUNTER — Other Ambulatory Visit: Payer: Self-pay

## 2021-02-04 DIAGNOSIS — F411 Generalized anxiety disorder: Secondary | ICD-10-CM

## 2021-02-04 DIAGNOSIS — G4700 Insomnia, unspecified: Secondary | ICD-10-CM

## 2021-02-04 DIAGNOSIS — G2581 Restless legs syndrome: Secondary | ICD-10-CM

## 2021-02-04 DIAGNOSIS — F902 Attention-deficit hyperactivity disorder, combined type: Secondary | ICD-10-CM | POA: Diagnosis not present

## 2021-02-04 DIAGNOSIS — F331 Major depressive disorder, recurrent, moderate: Secondary | ICD-10-CM | POA: Diagnosis not present

## 2021-02-04 MED ORDER — LATUDA 60 MG PO TABS: ORAL_TABLET | ORAL | 5 refills | Status: DC

## 2021-02-04 MED ORDER — ALPRAZOLAM 1 MG PO TABS: ORAL_TABLET | ORAL | 2 refills | Status: DC

## 2021-02-04 NOTE — Progress Notes (Signed)
Trevia Nop [id number] Oct 02, 1980 40 y.o.  Subjective:   Patient ID:  Tiffany Francis is a 40 y.o. (DOB 02-05-1981) female.  Chief Complaint: No chief complaint on file.   HPI Tiffany Francis presents to the office today for follow-up of ADHD, anxiety, depression, insomnia, and RLS.  Describes mood today as "so-so". Pleasant. Tearful at times. Mood symptoms - reports depression, anxiety, and irritability. Stating "I want to try and get off the medications" -  "I don't know if they are helpful or not". Wanting to focus more on "herself". Ongoing situational stressors mostly surrounding family dynamics. Youngest son has driver's permit. Expecting 2nd grandchild. Decreased interest and motivation. Taking medications as prescribed.  Energy levels low. Active, does not have a regular exercise routine.  Enjoys some usual interests and activities. Married. Spending time with family. Mostly staying home. Appetite adequate. Weight loss - 250 to 225 pounds. Sleeps well most nights. Averages 6 to 10 hours. Focus and concentration stable. Completing tasks. Managing some aspects of household. Works full time for [employer]. Denies SI or HI.  Denies AH or VH.   Review of Systems:  Review of Systems  Musculoskeletal:  Negative for gait problem.  Neurological:  Negative for tremors.  Psychiatric/Behavioral:         Please refer to HPI   Medications: I have reviewed the patient's current medications.  Current Outpatient Medications  Medication Sig Dispense Refill   ALPRAZolam (XANAX) 1 MG tablet TAKE 1 TABLET BY MOUTH FOUR TIMES DAILY AS NEEDED FOR ANXIETY 120 tablet 2   DULoxetine (CYMBALTA) 60 MG capsule TAKE 1 CAPSULE(60 MG) BY MOUTH TWICE DAILY 60 capsule 5   Lurasidone HCl (LATUDA) 60 MG TABS TAKE 1 TABLET BY MOUTH AT DINNER WITH 350 CALORIES 30 tablet 5   meloxicam (MOBIC) 15 MG tablet      omeprazole (PRILOSEC) 40 MG capsule      rOPINIRole (REQUIP) 1 MG tablet TAKE 1  TABLET(1 MG) BY MOUTH AT BEDTIME 30 tablet 5   tiZANidine (ZANAFLEX) 4 MG tablet TK 1 TO 1 AND 1/2 TS PO UP TO TID PRN     No current facility-administered medications for this visit.    Medication Side Effects: None  Allergies: No Known Allergies  No past medical history on file.  Past Medical History, Surgical history, Social history, and Family history were reviewed and updated as appropriate.   Please see review of systems for further details on the patient's review from today.   Objective:   Physical Exam:  There were no vitals taken for this visit.  Physical Exam Constitutional:      General: She is not in acute distress. Musculoskeletal:        General: No deformity.  Neurological:     Mental Status: She is alert and oriented to person, place, and time.     Coordination: Coordination normal.  Psychiatric:        Attention and Perception: Attention and perception normal. She does not perceive auditory or visual hallucinations.        Mood and Affect: Mood normal. Mood is not anxious or depressed. Affect is not labile, blunt, angry or inappropriate.        Speech: Speech normal.        Behavior: Behavior normal.        Thought Content: Thought content normal. Thought content is not paranoid or delusional. Thought content does not include homicidal or suicidal ideation. Thought content does not include homicidal or  suicidal plan.        Cognition and Memory: Cognition and memory normal.        Judgment: Judgment normal.     Comments: Insight intact    Lab Review:     Component Value Date/Time   NA 138 07/10/2008 1020   K 4.1 07/10/2008 1020   CL 107 07/10/2008 1020   CO2 24 07/10/2008 1020   GLUCOSE 90 07/10/2008 1020   BUN 11 07/10/2008 1020   CREATININE 0.66 07/10/2008 1020   CALCIUM 8.7 07/10/2008 1020   PROT 6.7 07/10/2008 1020   ALBUMIN 3.8 07/10/2008 1020   AST 26 07/10/2008 1020   ALT 29 07/10/2008 1020   ALKPHOS 97 07/10/2008 1020   BILITOT 0.6  07/10/2008 1020   GFRNONAA >60 07/10/2008 1020   GFRAA  07/10/2008 1020    >60        The eGFR has been calculated using the MDRD equation. This calculation has not been validated in all clinical       Component Value Date/Time   WBC 10.3 07/10/2008 1020   RBC 4.51 07/10/2008 1020   HGB 11.7 (L) 07/10/2008 1020   HCT 36.2 07/10/2008 1020   PLT 242 07/10/2008 1020   MCV 80.3 07/10/2008 1020   MCHC 32.2 07/10/2008 1020   RDW 15.6 (H) 07/10/2008 1020   LYMPHSABS 0.8 07/10/2008 1020   MONOABS 0.8 07/10/2008 1020   EOSABS 0.1 07/10/2008 1020   BASOSABS 0.1 07/10/2008 1020    No results found for: POCLITH, LITHIUM   No results found for: PHENYTOIN, PHENOBARB, VALPROATE, CBMZ   .res Assessment: Plan:    Plan:  Latuda 53m daily  Cymbalta 618mBID to target depression Requip 59m659mID   D/C Vyvanse 71m52mily D/CXanax 59mg 159m daily currently - 1.5 x 7, then 1 x 7, then 0.5 x 7, then d/c   Adderall 20mg 3mhe afternoon - has not been taking it  Labs: Ferritn TSH, B12, and Vitamin D  RTC 4 weeks  MoniquQuinbyrapist - has not been able to see.  Patient advised to contact office with any questions, adverse effects, or acute worsening in signs and symptoms.  Discussed potential metabolic side effects associated with atypical antipsychotics, as well as potential risk for movement side effects. Advised pt to contact office if movement side effects occur.   Discussed potential benefits, risks, and side effects of stimulants with patient to include increased heart rate, palpitations, insomnia, increased anxiety, increased irritability, or decreased appetite.  Instructed patient to contact office if experiencing any significant tolerability issues.  Discussed potential benefits, risk, and side effects of benzodiazepines to include potential risk of tolerance and dependence, as well as possible drowsiness.  Advised patient not to drive if experiencing drowsiness and  to take lowest possible effective dose to minimize risk of dependence and tolerance.  Diagnoses and all orders for this visit:  Attention deficit hyperactivity disorder (ADHD), combined type  Generalized anxiety disorder -     ALPRAZolam (XANAX) 1 MG tablet; TAKE 1 TABLET BY MOUTH FOUR TIMES DAILY AS NEEDED FOR ANXIETY  Major depressive disorder, recurrent episode, moderate (HCC) -     Lurasidone HCl (LATUDA) 60 MG TABS; TAKE 1 TABLET BY MOUTH AT DINNER WITH 350 CALORIES  Insomnia, unspecified type  Restless legs syndrome    Please see After Visit Summary for patient specific instructions.  Future Appointments  Date Time Provider [email or web address]  5:20 PM , ReginaBerdie Ogren  NP CP-CP None    No orders of the defined types were placed in this encounter.   -------------------------------

## 2021-02-10 ENCOUNTER — Telehealth: Payer: Self-pay | Admitting: Adult Health

## 2021-02-10 NOTE — Telephone Encounter (Signed)
Paytience called to let you know that she had her labs done.  She is hoping you can see them in the system.  She had them done at [facility]. If they are not in the system, let her know.  She'll need to request that they send the results to [place].

## 2021-02-10 NOTE — Telephone Encounter (Signed)
Noted  

## 2021-02-10 NOTE — Telephone Encounter (Signed)
Reviewed. Was she started on any supplementation?

## 2021-02-10 NOTE — Telephone Encounter (Signed)
Please review

## 2021-02-10 NOTE — Telephone Encounter (Signed)
She stated she is taking women's multivitamins with probiotics

## 2021-03-04 ENCOUNTER — Ambulatory Visit (INDEPENDENT_AMBULATORY_CARE_PROVIDER_SITE_OTHER): Payer: BC Managed Care – PPO | Admitting: Adult Health

## 2021-03-04 ENCOUNTER — Other Ambulatory Visit: Payer: Self-pay

## 2021-03-04 DIAGNOSIS — F331 Major depressive disorder, recurrent, moderate: Secondary | ICD-10-CM

## 2021-03-04 DIAGNOSIS — F411 Generalized anxiety disorder: Secondary | ICD-10-CM | POA: Diagnosis not present

## 2021-03-04 DIAGNOSIS — G2581 Restless legs syndrome: Secondary | ICD-10-CM

## 2021-03-04 DIAGNOSIS — G4700 Insomnia, unspecified: Secondary | ICD-10-CM

## 2021-03-04 DIAGNOSIS — F902 Attention-deficit hyperactivity disorder, combined type: Secondary | ICD-10-CM | POA: Diagnosis not present

## 2021-03-04 NOTE — Progress Notes (Signed)
Tiffany Francis [id number] [birth year] 40 y.o.  Subjective:   Patient ID:  Tiffany Francis is a 40 y.o. (DOB 1981/07/26) female.  Chief Complaint: No chief complaint on file.   HPI Tiffany Francis presents to the office today for follow-up of ADHD, anxiety, depression, insomnia, and RLS.  Describes mood today as "not the best". Pleasant. Tearful at times - "more often". Mood symptoms - reports depression, anxiety, and irritability. Has tapered off the Xanax and Vyvanse. Feels more "edgey" and "apt to say something she shouldn't". Got frustrated at work this week and said something she shouldn't have. Has felt overwhelmed in the work setting - new computer system. Would like to continue tapering off medications.  Ogoing situational stressors in the home setting. Getting to see her grandchildren more. Decreased interest and motivation. Taking medications as prescribed.  Energy levels low. Active, does not have a regular exercise routine.  Enjoys some usual interests and activities. Married. Spending time with family. Mostly staying home. Appetite adequate. Weight loss 25 pounds. Sleeps well most nights. Averages 6 to 8 hours of broken sleep. Focus and concentration stable. Completing tasks. Managing some aspects of household. Works full time for [employer]. Stating "I've had a stressful few weeks at work". Denies SI or HI.  Denies AH or VH.  Review of Systems:  Review of Systems  Musculoskeletal:  Negative for gait problem.  Neurological:  Negative for tremors.  Psychiatric/Behavioral:         Please refer to HPI   Medications: I have reviewed the patient's current medications.  Current Outpatient Medications  Medication Sig Dispense Refill   DULoxetine (CYMBALTA) 60 MG capsule TAKE 1 CAPSULE(60 MG) BY MOUTH TWICE DAILY 60 capsule 5   Lurasidone HCl (LATUDA) 60 MG TABS TAKE 1 TABLET BY MOUTH AT DINNER WITH 350 CALORIES 30 tablet 5   meloxicam (MOBIC) 15 MG tablet       omeprazole (PRILOSEC) 40 MG capsule      rOPINIRole (REQUIP) 1 MG tablet TAKE 1 TABLET(1 MG) BY MOUTH AT BEDTIME 30 tablet 5   tiZANidine (ZANAFLEX) 4 MG tablet TK 1 TO 1 AND 1/2 TS PO UP TO TID PRN     No current facility-administered medications for this visit.    Medication Side Effects: None  Allergies: No Known Allergies  No past medical history on file.  Past Medical History, Surgical history, Social history, and Family history were reviewed and updated as appropriate.   Please see review of systems for further details on the patient's review from today.   Objective:   Physical Exam:  There were no vitals taken for this visit.  Physical Exam Constitutional:      General: She is not in acute distress. Musculoskeletal:        General: No deformity.  Neurological:     Mental Status: She is alert and oriented to person, place, and time.     Coordination: Coordination normal.  Psychiatric:        Attention and Perception: Attention and perception normal. She does not perceive auditory or visual hallucinations.        Mood and Affect: Mood normal. Mood is not anxious or depressed. Affect is not labile, blunt, angry or inappropriate.        Speech: Speech normal.        Behavior: Behavior normal.        Thought Content: Thought content normal. Thought content is not paranoid or delusional. Thought content does not include homicidal  or suicidal ideation. Thought content does not include homicidal or suicidal plan.        Cognition and Memory: Cognition and memory normal.        Judgment: Judgment normal.     Comments: Insight intact    Lab Review:     Component Value Date/Time   NA 138 07/10/2008 1020   K 4.1 07/10/2008 1020   CL 107 07/10/2008 1020   CO2 24 07/10/2008 1020   GLUCOSE 90 07/10/2008 1020   BUN 11 07/10/2008 1020   CREATININE 0.66 07/10/2008 1020   CALCIUM 8.7 07/10/2008 1020   PROT 6.7 07/10/2008 1020   ALBUMIN 3.8 07/10/2008 1020   AST 26 07/10/2008  1020   ALT 29 07/10/2008 1020   ALKPHOS 97 07/10/2008 1020   BILITOT 0.6 07/10/2008 1020   GFRNONAA >60 07/10/2008 1020   GFRAA  07/10/2008 1020    >60        The eGFR has been calculated using the MDRD equation. This calculation has not been validated in all clinical       Component Value Date/Time   WBC 10.3 07/10/2008 1020   RBC 4.51 07/10/2008 1020   HGB 11.7 (L) 07/10/2008 1020   HCT 36.2 07/10/2008 1020   PLT 242 07/10/2008 1020   MCV 80.3 07/10/2008 1020   MCHC 32.2 07/10/2008 1020   RDW 15.6 (H) 07/10/2008 1020   LYMPHSABS 0.8 07/10/2008 1020   MONOABS 0.8 07/10/2008 1020   EOSABS 0.1 07/10/2008 1020   BASOSABS 0.1 07/10/2008 1020    No results found for: POCLITH, LITHIUM   No results found for: PHENYTOIN, PHENOBARB, VALPROATE, CBMZ   .res Assessment: Plan:    Plan:  Taper off Latuda 75m daily - 458mdaily x 7 days, then 2078maily x 7 days, then leave off. Cymbalta 90m74mD to target depression Requip 1mg 41mwo at bedtime  Adderall 20mg 33mhe morning  Labs: Ferritn TSH, B12, and Vitamin D  RTC 4 weeks  [facility] - has not been able to see.  Patient advised to contact office with any questions, adverse effects, or acute worsening in signs and symptoms.  Discussed potential metabolic side effects associated with atypical antipsychotics, as well as potential risk for movement side effects. Advised pt to contact office if movement side effects occur.   Discussed potential benefits, risks, and side effects of stimulants with patient to include increased heart rate, palpitations, insomnia, increased anxiety, increased irritability, or decreased appetite.  Instructed patient to contact office if experiencing any significant tolerability issues.  Discussed potential benefits, risk, and side effects of benzodiazepines to include potential risk of tolerance and dependence, as well as possible drowsiness.  Advised patient not to drive if  experiencing drowsiness and to take lowest possible effective dose to minimize risk of dependence and tolerance.  Diagnoses and all orders for this visit:  Attention deficit hyperactivity disorder (ADHD), combined type  Generalized anxiety disorder  Major depressive disorder, recurrent episode, moderate (HCC)  Insomnia, unspecified type  Restless legs syndrome    Please see After Visit Summary for patient specific instructions.  Future Appointments  Date Time Provider DepartPaxico/2022  5:20 PM Indiya Izquierdo, ReginaBerdie OgrenP-CP None    No orders of the defined types were placed in this encounter.   -------------------------------

## 2021-03-05 ENCOUNTER — Encounter: Payer: Self-pay | Admitting: Adult Health

## 2021-03-24 ENCOUNTER — Other Ambulatory Visit: Payer: Self-pay

## 2021-03-24 ENCOUNTER — Ambulatory Visit (INDEPENDENT_AMBULATORY_CARE_PROVIDER_SITE_OTHER): Payer: BC Managed Care – PPO | Admitting: Adult Health

## 2021-03-24 DIAGNOSIS — F902 Attention-deficit hyperactivity disorder, combined type: Secondary | ICD-10-CM

## 2021-03-24 DIAGNOSIS — G4700 Insomnia, unspecified: Secondary | ICD-10-CM | POA: Diagnosis not present

## 2021-03-24 DIAGNOSIS — F411 Generalized anxiety disorder: Secondary | ICD-10-CM | POA: Diagnosis not present

## 2021-03-24 DIAGNOSIS — G2581 Restless legs syndrome: Secondary | ICD-10-CM

## 2021-03-24 DIAGNOSIS — F331 Major depressive disorder, recurrent, moderate: Secondary | ICD-10-CM | POA: Diagnosis not present

## 2021-03-24 MED ORDER — AMPHETAMINE-DEXTROAMPHET ER 20 MG PO CP24: 20.0000 mg | ORAL_CAPSULE | Freq: Every day | ORAL | 0 refills | Status: DC

## 2021-03-24 NOTE — Progress Notes (Signed)
Tiffany Francis [id number] November 10, 1980 40 y.o.  Subjective:   Patient ID:  Tiffany Francis is a 40 y.o. (DOB Oct 15, 1980) female.  Chief Complaint: No chief complaint on file.   HPI Tiffany Francis presents to the office today for follow-up of ADHD, anxiety, depression, insomnia, and RLS.  Describes mood today as "ok, not real bad". Pleasant. Tearful at times - "yes and no". Mood symptoms - reports "some" depression, anxiety, and irritability. Has tapered off the Latuda without any mood fluctuations. Not getting as "irritated" as she was. Things in work setting "worked" out. Would like to continue tapering off medications, but plans to see PCP about lab results first. Feels tired and fatigued. Improved interest and motivation. Taking medications as prescribed.  Energy levels low. Active, does not have a regular exercise routine.  Enjoys some usual interests and activities. Married. Spending time with family. Mostly staying home. Appetite adequate. Weight loss. Sleeps well most nights. Averages 6 hours - using Melatonin. Focus and concentration stable. Completing tasks. Managing some aspects of household. Works full time for [employer].  Denies SI or HI.  Denies AH or VH.      Review of Systems:  Review of Systems  Musculoskeletal:  Negative for gait problem.  Neurological:  Negative for tremors.  Psychiatric/Behavioral:         Please refer to HPI   Medications: I have reviewed the patient's current medications.  Current Outpatient Medications  Medication Sig Dispense Refill   amphetamine-dextroamphetamine (ADDERALL XR) 20 MG 24 hr capsule Take 1 capsule (20 mg total) by mouth daily. 30 capsule 0   DULoxetine (CYMBALTA) 60 MG capsule TAKE 1 CAPSULE(60 MG) BY MOUTH TWICE DAILY 60 capsule 5   meloxicam (MOBIC) 15 MG tablet      omeprazole (PRILOSEC) 40 MG capsule      rOPINIRole (REQUIP) 1 MG tablet TAKE 1 TABLET(1 MG) BY MOUTH AT BEDTIME 30 tablet 5   tiZANidine  (ZANAFLEX) 4 MG tablet TK 1 TO 1 AND 1/2 TS PO UP TO TID PRN     No current facility-administered medications for this visit.    Medication Side Effects: None  Allergies: No Known Allergies  No past medical history on file.  Past Medical History, Surgical history, Social history, and Family history were reviewed and updated as appropriate.   Please see review of systems for further details on the patient's review from today.   Objective:   Physical Exam:  There were no vitals taken for this visit.  Physical Exam Constitutional:      General: She is not in acute distress. Musculoskeletal:        General: No deformity.  Neurological:     Mental Status: She is alert and oriented to person, place, and time.     Coordination: Coordination normal.  Psychiatric:        Attention and Perception: Attention and perception normal. She does not perceive auditory or visual hallucinations.        Mood and Affect: Mood normal. Mood is not anxious or depressed. Affect is not labile, blunt, angry or inappropriate.        Speech: Speech normal.        Behavior: Behavior normal.        Thought Content: Thought content normal. Thought content is not paranoid or delusional. Thought content does not include homicidal or suicidal ideation. Thought content does not include homicidal or suicidal plan.        Cognition and Memory: Cognition  and memory normal.        Judgment: Judgment normal.     Comments: Insight intact    Lab Review:     Component Value Date/Time   NA 138 07/10/2008 1020   K 4.1 07/10/2008 1020   CL 107 07/10/2008 1020   CO2 24 07/10/2008 1020   GLUCOSE 90 07/10/2008 1020   BUN 11 07/10/2008 1020   CREATININE 0.66 07/10/2008 1020   CALCIUM 8.7 07/10/2008 1020   PROT 6.7 07/10/2008 1020   ALBUMIN 3.8 07/10/2008 1020   AST 26 07/10/2008 1020   ALT 29 07/10/2008 1020   ALKPHOS 97 07/10/2008 1020   BILITOT 0.6 07/10/2008 1020   GFRNONAA >60 07/10/2008 1020   GFRAA   07/10/2008 1020    >60        The eGFR has been calculated using the MDRD equation. This calculation has not been validated in all clinical       Component Value Date/Time   WBC 10.3 07/10/2008 1020   RBC 4.51 07/10/2008 1020   HGB 11.7 (L) 07/10/2008 1020   HCT 36.2 07/10/2008 1020   PLT 242 07/10/2008 1020   MCV 80.3 07/10/2008 1020   MCHC 32.2 07/10/2008 1020   RDW 15.6 (H) 07/10/2008 1020   LYMPHSABS 0.8 07/10/2008 1020   MONOABS 0.8 07/10/2008 1020   EOSABS 0.1 07/10/2008 1020   BASOSABS 0.1 07/10/2008 1020    No results found for: POCLITH, LITHIUM   No results found for: PHENYTOIN, PHENOBARB, VALPROATE, CBMZ   .res Assessment: Plan:     Plan:  Has tapered off of Latuda.  Cymbalta 56m BID to target depression Requip 170m- two at bedtime  D/C Adderall 2034mn the morning - has not been able to get out of stock Add Adderall XR 77m9mily  Labs: Ferritn TSH, B12, and Vitamin D - following up with PCP on September 1st  RTC 4 weeks  [facility]rapist - has not been able to see.  Patient advised to contact office with any questions, adverse effects, or acute worsening in signs and symptoms.  Discussed potential metabolic side effects associated with atypical antipsychotics, as well as potential risk for movement side effects. Advised pt to contact office if movement side effects occur.   Discussed potential benefits, risks, and side effects of stimulants with patient to include increased heart rate, palpitations, insomnia, increased anxiety, increased irritability, or decreased appetite.  Instructed patient to contact office if experiencing any significant tolerability issues.  Discussed potential benefits, risk, and side effects of benzodiazepines to include potential risk of tolerance and dependence, as well as possible drowsiness.  Advised patient not to drive if experiencing drowsiness and to take lowest possible effective dose to minimize risk  of dependence and tolerance.  Diagnoses and all orders for this visit:  Attention deficit hyperactivity disorder (ADHD), combined type -     amphetamine-dextroamphetamine (ADDERALL XR) 20 MG 24 hr capsule; Take 1 capsule (20 mg total) by mouth daily.  Generalized anxiety disorder  Major depressive disorder, recurrent episode, moderate (HCC)  Insomnia, unspecified type  Restless legs syndrome    Please see After Visit Summary for patient specific instructions.  Future Appointments  Date Time Provider DepaWaterproof22/2022  5:20 PM , RegiBerdie Ogren CP-CP None    No orders of the defined types were placed in this encounter.   -------------------------------

## 2021-03-25 ENCOUNTER — Telehealth: Payer: Self-pay | Admitting: Adult Health

## 2021-03-25 ENCOUNTER — Encounter: Payer: Self-pay | Admitting: Adult Health

## 2021-03-25 NOTE — Telephone Encounter (Signed)
Pt needs PA for Adderall XR 20 mg per [facility]

## 2021-03-27 NOTE — Telephone Encounter (Signed)
Prior authorization is pending at this time.  

## 2021-03-28 NOTE — Telephone Encounter (Signed)
Prior approval received yesterday 03/27/2021-03/27/2022

## 2021-04-24 ENCOUNTER — Other Ambulatory Visit: Payer: Self-pay

## 2021-04-24 ENCOUNTER — Ambulatory Visit (INDEPENDENT_AMBULATORY_CARE_PROVIDER_SITE_OTHER): Payer: BC Managed Care – PPO | Admitting: Adult Health

## 2021-04-24 DIAGNOSIS — G2581 Restless legs syndrome: Secondary | ICD-10-CM | POA: Diagnosis not present

## 2021-04-24 DIAGNOSIS — F331 Major depressive disorder, recurrent, moderate: Secondary | ICD-10-CM | POA: Diagnosis not present

## 2021-04-24 DIAGNOSIS — F902 Attention-deficit hyperactivity disorder, combined type: Secondary | ICD-10-CM | POA: Diagnosis not present

## 2021-04-24 DIAGNOSIS — F411 Generalized anxiety disorder: Secondary | ICD-10-CM | POA: Diagnosis not present

## 2021-04-24 MED ORDER — DULOXETINE HCL 30 MG PO CPEP: 30.0000 mg | ORAL_CAPSULE | Freq: Every day | ORAL | 2 refills | Status: DC

## 2021-04-24 MED ORDER — AMPHETAMINE-DEXTROAMPHET ER 20 MG PO CP24: 20.0000 mg | ORAL_CAPSULE | Freq: Every day | ORAL | 0 refills | Status: DC

## 2021-04-24 NOTE — Progress Notes (Signed)
Tiffany Francis [id number] [birth year] 40 y.o.  Subjective:   Patient ID:  Tiffany Francis is a 40 y.o. (DOB 09-23-1980) female.  Chief Complaint: No chief complaint on file.   HPI Tiffany Francis presents to the office today for follow-up of ADHD, anxiety, depression, insomnia, and RLS.  Describes mood today as "ok". Pleasant. Tearful at times. Mood symptoms - reports depression, anxiety, and irritability. Denies panic attacks. Denies mood fluctuations - "just more down". Would like to continue tapering off medications - Cymbalta. Discussed recent labs with PCP and no changes were made. Has started OTC vitamin supplementation. Has a trip planned for the weekend - [place] her and son's birthday. Improved interest and motivation. Taking medications as prescribed.  Energy levels low. Active, does not have a regular exercise routine.  Enjoys some usual interests and activities. Married. Lives with husband and son. Spending time with family.  Appetite adequate. Weight stable. Sleeps well most nights. Averages 6 to 7 hours. Focus and concentration stable. Completing tasks. Managing some aspects of household. Works full time for [employer].  Denies SI or HI.  Denies AH or VH.   Review of Systems:  Review of Systems  Musculoskeletal:  Negative for gait problem.  Neurological:  Negative for tremors.  Psychiatric/Behavioral:         Please refer to HPI   Medications: I have reviewed the patient's current medications.  Current Outpatient Medications  Medication Sig Dispense Refill   DULoxetine (CYMBALTA) 30 MG capsule Take 1 capsule (30 mg total) by mouth daily. 30 capsule 2   amphetamine-dextroamphetamine (ADDERALL XR) 20 MG 24 hr capsule Take 1 capsule (20 mg total) by mouth daily. 30 capsule 0   DULoxetine (CYMBALTA) 60 MG capsule TAKE 1 CAPSULE(60 MG) BY MOUTH TWICE DAILY 60 capsule 5   meloxicam (MOBIC) 15 MG tablet      omeprazole (PRILOSEC) 40 MG capsule       rOPINIRole (REQUIP) 1 MG tablet TAKE 1 TABLET(1 MG) BY MOUTH AT BEDTIME 30 tablet 5   tiZANidine (ZANAFLEX) 4 MG tablet TK 1 TO 1 AND 1/2 TS PO UP TO TID PRN     No current facility-administered medications for this visit.    Medication Side Effects: None  Allergies: No Known Allergies  No past medical history on file.  Past Medical History, Surgical history, Social history, and Family history were reviewed and updated as appropriate.   Please see review of systems for further details on the patient's review from today.   Objective:   Physical Exam:  There were no vitals taken for this visit.  Physical Exam Constitutional:      General: She is not in acute distress. Musculoskeletal:        General: No deformity.  Neurological:     Mental Status: She is alert and oriented to person, place, and time.     Coordination: Coordination normal.  Psychiatric:        Attention and Perception: Attention and perception normal. She does not perceive auditory or visual hallucinations.        Mood and Affect: Mood normal. Mood is not anxious or depressed. Affect is not labile, blunt, angry or inappropriate.        Speech: Speech normal.        Behavior: Behavior normal.        Thought Content: Thought content normal. Thought content is not paranoid or delusional. Thought content does not include homicidal or suicidal ideation. Thought  content does not include homicidal or suicidal plan.        Cognition and Memory: Cognition and memory normal.        Judgment: Judgment normal.     Comments: Insight intact    Lab Review:     Component Value Date/Time   NA 138 07/10/2008 1020   K 4.1 07/10/2008 1020   CL 107 07/10/2008 1020   CO2 24 07/10/2008 1020   GLUCOSE 90 07/10/2008 1020   BUN 11 07/10/2008 1020   CREATININE 0.66 07/10/2008 1020   CALCIUM 8.7 07/10/2008 1020   PROT 6.7 07/10/2008 1020   ALBUMIN 3.8 07/10/2008 1020   AST 26 07/10/2008 1020   ALT 29 07/10/2008 1020    ALKPHOS 97 07/10/2008 1020   BILITOT 0.6 07/10/2008 1020   GFRNONAA >60 07/10/2008 1020   GFRAA  07/10/2008 1020    >60        The eGFR has been calculated using the MDRD equation. This calculation has not been validated in all clinical       Component Value Date/Time   WBC 10.3 07/10/2008 1020   RBC 4.51 07/10/2008 1020   HGB 11.7 (L) 07/10/2008 1020   HCT 36.2 07/10/2008 1020   PLT 242 07/10/2008 1020   MCV 80.3 07/10/2008 1020   MCHC 32.2 07/10/2008 1020   RDW 15.6 (H) 07/10/2008 1020   LYMPHSABS 0.8 07/10/2008 1020   MONOABS 0.8 07/10/2008 1020   EOSABS 0.1 07/10/2008 1020   BASOSABS 0.1 07/10/2008 1020    No results found for: POCLITH, LITHIUM   No results found for: PHENYTOIN, PHENOBARB, VALPROATE, CBMZ   .res Assessment: Plan:    Plan:  Cymbalta 83m BID to target depression - taper instructions discussed and written down and given to patient.  Requip 154m- two at bedtime  Adderall XR 2043maily  Met with PCP - started on vitamin supplements  RTC 4 weeks  [facility]therapist - has not been able to see.  Patient advised to contact office with any questions, adverse effects, or acute worsening in signs and symptoms.  Discussed potential benefits, risks, and side effects of stimulants with patient to include increased heart rate, palpitations, insomnia, increased anxiety, increased irritability, or decreased appetite.  Instructed patient to contact office if experiencing any significant tolerability issues.  Discussed potential benefits, risk, and side effects of benzodiazepines to include potential risk of tolerance and dependence, as well as possible drowsiness.  Advised patient not to drive if experiencing drowsiness and to take lowest possible effective dose to minimize risk of dependence and tolerance.  Diagnoses and all orders for this visit:  Major depressive disorder, recurrent episode, moderate (HCC) -     DULoxetine (CYMBALTA) 30 MG  capsule; Take 1 capsule (30 mg total) by mouth daily.  Attention deficit hyperactivity disorder (ADHD), combined type -     amphetamine-dextroamphetamine (ADDERALL XR) 20 MG 24 hr capsule; Take 1 capsule (20 mg total) by mouth daily.  Generalized anxiety disorder  Restless legs syndrome    Please see After Visit Summary for patient specific instructions.  Future Appointments  Date Time Provider DepFremont0/19/2022  5:20 PM Kameisha Malicki, RegBerdie OgrenP CP-CP None    No orders of the defined types were placed in this encounter.   -------------------------------

## 2021-04-25 ENCOUNTER — Encounter: Payer: Self-pay | Admitting: Adult Health

## 2021-05-21 ENCOUNTER — Other Ambulatory Visit: Payer: Self-pay

## 2021-05-21 ENCOUNTER — Ambulatory Visit (INDEPENDENT_AMBULATORY_CARE_PROVIDER_SITE_OTHER): Payer: BC Managed Care – PPO | Admitting: Adult Health

## 2021-05-21 DIAGNOSIS — G2581 Restless legs syndrome: Secondary | ICD-10-CM

## 2021-05-21 DIAGNOSIS — F331 Major depressive disorder, recurrent, moderate: Secondary | ICD-10-CM

## 2021-05-21 DIAGNOSIS — F411 Generalized anxiety disorder: Secondary | ICD-10-CM

## 2021-05-21 DIAGNOSIS — F902 Attention-deficit hyperactivity disorder, combined type: Secondary | ICD-10-CM | POA: Diagnosis not present

## 2021-05-22 ENCOUNTER — Encounter: Payer: Self-pay | Admitting: Adult Health

## 2021-05-22 MED ORDER — AMPHETAMINE-DEXTROAMPHET ER 20 MG PO CP24: 20.0000 mg | ORAL_CAPSULE | Freq: Every day | ORAL | 0 refills | Status: DC

## 2021-05-22 MED ORDER — ROPINIROLE HCL 1 MG PO TABS: 1.0000 mg | ORAL_TABLET | Freq: Every day | ORAL | 5 refills | Status: DC

## 2021-05-22 NOTE — Progress Notes (Signed)
Tiffany Francis [id number] 04/27/1981 40 y.o.  Subjective:   Patient ID:  Tiffany Francis is a 40 y.o. (DOB 07/12/1981) female.  Chief Complaint: No chief complaint on file.   HPI Spring Dechellis presents to the office today for follow-up of ADHD, anxiety, depression, insomnia, and RLS.  Describes mood today as "ok". Pleasant. Tearful at times. Mood symptoms - reports depression, anxiety, and irritability. Denies panic attacks. Denies mood instability. Has tapered off of Cymbalta. Stating "I'm doing ok". Would like to continue with Adderall for now and give it a month before reintroducing another medication. Has been spending more time with oldest son and her 2 grandchildren. Improved interest and motivation. Taking medications as prescribed.  Energy levels lower. Active, does not have a regular exercise routine.  Enjoys some usual interests and activities. Married. Lives with husband and son. Spending time with family.  Appetite adequate. Weight stable. Sleeps well most nights. Averages 6 to 7 hours. Focus and concentration stable. Completing tasks. Managing some aspects of household. Works full time for [place] [employer] office.  Denies SI or HI.  Denies AH or VH.   Review of Systems:  Review of Systems  Musculoskeletal:  Negative for gait problem.  Neurological:  Negative for tremors.  Psychiatric/Behavioral:         Please refer to HPI   Medications: I have reviewed the patient's current medications.  Current Outpatient Medications  Medication Sig Dispense Refill   amphetamine-dextroamphetamine (ADDERALL XR) 20 MG 24 hr capsule Take 1 capsule (20 mg total) by mouth daily. 30 capsule 0   DULoxetine (CYMBALTA) 30 MG capsule Take 1 capsule (30 mg total) by mouth daily. 30 capsule 2   DULoxetine (CYMBALTA) 60 MG capsule TAKE 1 CAPSULE(60 MG) BY MOUTH TWICE DAILY 60 capsule 5   meloxicam (MOBIC) 15 MG tablet      omeprazole (PRILOSEC) 40 MG capsule      rOPINIRole (REQUIP) 1 MG  tablet Take 1 tablet (1 mg total) by mouth at bedtime. 30 tablet 5   tiZANidine (ZANAFLEX) 4 MG tablet TK 1 TO 1 AND 1/2 TS PO UP TO TID PRN     No current facility-administered medications for this visit.    Medication Side Effects: None  Allergies: No Known Allergies  No past medical history on file.  Past Medical History, Surgical history, Social history, and Family history were reviewed and updated as appropriate.   Please see review of systems for further details on the patient's review from today.   Objective:   Physical Exam:  There were no vitals taken for this visit.  Physical Exam Constitutional:      General: She is not in acute distress. Musculoskeletal:        General: No deformity.  Neurological:     Mental Status: She is alert and oriented to person, place, and time.     Coordination: Coordination normal.  Psychiatric:        Attention and Perception: Attention and perception normal. She does not perceive auditory or visual hallucinations.        Mood and Affect: Mood normal. Mood is not anxious or depressed. Affect is not labile, blunt, angry or inappropriate.        Speech: Speech normal.        Behavior: Behavior normal.        Thought Content: Thought content normal. Thought content is not paranoid or delusional. Thought content does not include homicidal or suicidal ideation. Thought content does not include homicidal   or suicidal plan.        Cognition and Memory: Cognition and memory normal.        Judgment: Judgment normal.     Comments: Insight intact    Lab Review:     Component Value Date/Time   NA 138 07/10/2008 1020   K 4.1 07/10/2008 1020   CL 107 07/10/2008 1020   CO2 24 07/10/2008 1020   GLUCOSE 90 07/10/2008 1020   BUN 11 07/10/2008 1020   CREATININE 0.66 07/10/2008 1020   CALCIUM 8.7 07/10/2008 1020   PROT 6.7 07/10/2008 1020   ALBUMIN 3.8 07/10/2008 1020   AST 26 07/10/2008 1020   ALT 29 07/10/2008 1020   ALKPHOS 97 07/10/2008  1020   BILITOT 0.6 07/10/2008 1020   GFRNONAA >60 07/10/2008 1020   GFRAA  07/10/2008 1020    >60        The eGFR has been calculated using the MDRD equation. This calculation has not been validated in all clinical       Component Value Date/Time   WBC 10.3 07/10/2008 1020   RBC 4.51 07/10/2008 1020   HGB 11.7 (L) 07/10/2008 1020   HCT 36.2 07/10/2008 1020   PLT 242 07/10/2008 1020   MCV 80.3 07/10/2008 1020   MCHC 32.2 07/10/2008 1020   RDW 15.6 (H) 07/10/2008 1020   LYMPHSABS 0.8 07/10/2008 1020   MONOABS 0.8 07/10/2008 1020   EOSABS 0.1 07/10/2008 1020   BASOSABS 0.1 07/10/2008 1020    No results found for: POCLITH, LITHIUM   No results found for: PHENYTOIN, PHENOBARB, VALPROATE, CBMZ   .res Assessment: Plan:    Plan:  Requip 1mg - two at bedtime  Adderall XR 20mg daily  Met with PCP - started on vitamin supplements  RTC 4 weeks  Monique Crutchfield - therapist - has not been able to see.  Patient advised to contact office with any questions, adverse effects, or acute worsening in signs and symptoms.  Discussed potential benefits, risks, and side effects of stimulants with patient to include increased heart rate, palpitations, insomnia, increased anxiety, increased irritability, or decreased appetite.  Instructed patient to contact office if experiencing any significant tolerability issues.  Discussed potential benefits, risk, and side effects of benzodiazepines to include potential risk of tolerance and dependence, as well as possible drowsiness.  Advised patient not to drive if experiencing drowsiness and to take lowest possible effective dose to minimize risk of dependence and tolerance.  Diagnoses and all orders for this visit:  Major depressive disorder, recurrent episode, moderate (HCC)  Attention deficit hyperactivity disorder (ADHD), combined type -     amphetamine-dextroamphetamine (ADDERALL XR) 20 MG 24 hr capsule; Take 1 capsule (20 mg total) by  mouth daily.  Restless legs syndrome -     rOPINIRole (REQUIP) 1 MG tablet; Take 1 tablet (1 mg total) by mouth at bedtime.  Generalized anxiety disorder    Please see After Visit Summary for patient specific instructions.  No future appointments.  No orders of the defined types were placed in this encounter.   -------------------------------  

## 2021-05-22 NOTE — Progress Notes (Deleted)
Tiffany Francis [id number] [birth year] 40 y.o.  Subjective:   Patient ID:  Tiffany Francis is a 40 y.o. (DOB May 08, 1981) female.  Chief Complaint: No chief complaint on file.   HPI Tiffany Francis presents to the office today for follow-up of ADHD, anxiety, depression, insomnia, and RLS.  Describes mood today as "ok". Pleasant. Tearful at times. Mood symptoms - reports depression, anxiety, and irritability. Denies panic attacks. Denies mood instability. Has tapered off of Cymbalta. Stating "I'm doing ok". Would like to continue with Adderall for now and give it a month before reintroducing another medication. Has been spending more time with oldest son and her 2 grandchildren. Improved interest and motivation. Taking medications as prescribed.  Energy levels lower. Active, does not have a regular exercise routine.  Enjoys some usual interests and activities. Married. Lives with husband and son. Spending time with family.  Appetite adequate. Weight stable. Sleeps well most nights. Averages 6 to 7 hours. Focus and concentration stable. Completing tasks. Managing some aspects of household. Works full time for [employer].  Denies SI or HI.  Denies AH or VH.   Review of Systems:  Review of Systems  Musculoskeletal:  Negative for gait problem.  Neurological:  Negative for tremors.  Psychiatric/Behavioral:         Please refer to HPI   Medications: I have reviewed the patient's current medications.  Current Outpatient Medications  Medication Sig Dispense Refill   amphetamine-dextroamphetamine (ADDERALL XR) 20 MG 24 hr capsule Take 1 capsule (20 mg total) by mouth daily. 30 capsule 0   DULoxetine (CYMBALTA) 30 MG capsule Take 1 capsule (30 mg total) by mouth daily. 30 capsule 2   DULoxetine (CYMBALTA) 60 MG capsule TAKE 1 CAPSULE(60 MG) BY MOUTH TWICE DAILY 60 capsule 5   meloxicam (MOBIC) 15 MG tablet      omeprazole (PRILOSEC) 40 MG capsule      rOPINIRole (REQUIP) 1 MG  tablet Take 1 tablet (1 mg total) by mouth at bedtime. 30 tablet 5   tiZANidine (ZANAFLEX) 4 MG tablet TK 1 TO 1 AND 1/2 TS PO UP TO TID PRN     No current facility-administered medications for this visit.    Medication Side Effects: None  Allergies: No Known Allergies  No past medical history on file.  Past Medical History, Surgical history, Social history, and Family history were reviewed and updated as appropriate.   Please see review of systems for further details on the patient's review from today.   Objective:   Physical Exam:  There were no vitals taken for this visit.  Physical Exam Constitutional:      General: She is not in acute distress. Musculoskeletal:        General: No deformity.  Neurological:     Mental Status: She is alert and oriented to person, place, and time.     Coordination: Coordination normal.  Psychiatric:        Attention and Perception: Attention and perception normal. She does not perceive auditory or visual hallucinations.        Mood and Affect: Mood normal. Mood is not anxious or depressed. Affect is not labile, blunt, angry or inappropriate.        Speech: Speech normal.        Behavior: Behavior normal.        Thought Content: Thought content normal. Thought content is not paranoid or delusional. Thought content does not include homicidal or suicidal ideation. Thought content does not include homicidal  or suicidal plan.        Cognition and Memory: Cognition and memory normal.        Judgment: Judgment normal.     Comments: Insight intact    Lab Review:     Component Value Date/Time   NA 138 07/10/2008 1020   K 4.1 07/10/2008 1020   CL 107 07/10/2008 1020   CO2 24 07/10/2008 1020   GLUCOSE 90 07/10/2008 1020   BUN 11 07/10/2008 1020   CREATININE 0.66 07/10/2008 1020   CALCIUM 8.7 07/10/2008 1020   PROT 6.7 07/10/2008 1020   ALBUMIN 3.8 07/10/2008 1020   AST 26 07/10/2008 1020   ALT 29 07/10/2008 1020   ALKPHOS 97 07/10/2008  1020   BILITOT 0.6 07/10/2008 1020   GFRNONAA >60 07/10/2008 1020   GFRAA  07/10/2008 1020    >60        The eGFR has been calculated using the MDRD equation. This calculation has not been validated in all clinical       Component Value Date/Time   WBC 10.3 07/10/2008 1020   RBC 4.51 07/10/2008 1020   HGB 11.7 (L) 07/10/2008 1020   HCT 36.2 07/10/2008 1020   PLT 242 07/10/2008 1020   MCV 80.3 07/10/2008 1020   MCHC 32.2 07/10/2008 1020   RDW 15.6 (H) 07/10/2008 1020   LYMPHSABS 0.8 07/10/2008 1020   MONOABS 0.8 07/10/2008 1020   EOSABS 0.1 07/10/2008 1020   BASOSABS 0.1 07/10/2008 1020    No results found for: POCLITH, LITHIUM   No results found for: PHENYTOIN, PHENOBARB, VALPROATE, CBMZ   .res Assessment: Plan:    Plan:  Requip 42m - two at bedtime  Adderall XR 21mdaily  Met with PCP - started on vitamin supplements  RTC 4 weeks  [place] therapist - has not been able to see.  Patient advised to contact office with any questions, adverse effects, or acute worsening in signs and symptoms.  Discussed potential benefits, risks, and side effects of stimulants with patient to include increased heart rate, palpitations, insomnia, increased anxiety, increased irritability, or decreased appetite.  Instructed patient to contact office if experiencing any significant tolerability issues.  Discussed potential benefits, risk, and side effects of benzodiazepines to include potential risk of tolerance and dependence, as well as possible drowsiness.  Advised patient not to drive if experiencing drowsiness and to take lowest possible effective dose to minimize risk of dependence and tolerance.  Diagnoses and all orders for this visit:  Major depressive disorder, recurrent episode, moderate (HCC)  Attention deficit hyperactivity disorder (ADHD), combined type -     amphetamine-dextroamphetamine (ADDERALL XR) 20 MG 24 hr capsule; Take 1 capsule (20 mg total) by  mouth daily.  Restless legs syndrome -     rOPINIRole (REQUIP) 1 MG tablet; Take 1 tablet (1 mg total) by mouth at bedtime.  Generalized anxiety disorder    Please see After Visit Summary for patient specific instructions.  No future appointments.  No orders of the defined types were placed in this encounter.   -------------------------------

## 2021-05-22 NOTE — Progress Notes (Deleted)
Tiffany Francis [id number] [birth year] 40 y.o.  Subjective:   Patient ID:  Tiffany Francis is a 40 y.o. (DOB 12/13/1980) female.  Chief Complaint: No chief complaint on file.   HPI Tiffany Francis presents to the office today for follow-up of ADHD, anxiety, depression, insomnia, and RLS.   Describes mood today as "ok". Pleasant. Tearful at times. Mood symptoms - reports depression, anxiety, and irritability. Denies panic attacks. Denies mood instability. Has tapered off of Cymbalta. Stating "I'm doing ok". Would like to continue with Adderall for now and give it a month before reintroducing another medication. Has been spending more time with oldest son and her 2 grandchildren. Improved interest and motivation. Taking medications as prescribed.  Energy levels lower. Active, does not have a regular exercise routine.  Enjoys some usual interests and activities. Married. Lives with husband and son. Spending time with family.  Appetite adequate. Weight stable. Sleeps well most nights. Averages 6 to 7 hours. Focus and concentration stable. Completing tasks. Managing some aspects of household. Works full time for [employer].  Denies SI or HI.  Denies AH or VH.     Review of Systems:  Review of Systems  Musculoskeletal:  Negative for gait problem.  Neurological:  Negative for tremors.  Psychiatric/Behavioral:         Please refer to HPI   Medications: I have reviewed the patient's current medications.  Current Outpatient Medications  Medication Sig Dispense Refill   amphetamine-dextroamphetamine (ADDERALL XR) 20 MG 24 hr capsule Take 1 capsule (20 mg total) by mouth daily. 30 capsule 0   DULoxetine (CYMBALTA) 30 MG capsule Take 1 capsule (30 mg total) by mouth daily. 30 capsule 2   DULoxetine (CYMBALTA) 60 MG capsule TAKE 1 CAPSULE(60 MG) BY MOUTH TWICE DAILY 60 capsule 5   meloxicam (MOBIC) 15 MG tablet      omeprazole (PRILOSEC) 40 MG capsule      rOPINIRole (REQUIP) 1 MG  tablet Take 1 tablet (1 mg total) by mouth at bedtime. 30 tablet 5   tiZANidine (ZANAFLEX) 4 MG tablet TK 1 TO 1 AND 1/2 TS PO UP TO TID PRN     No current facility-administered medications for this visit.    Medication Side Effects: None  Allergies: No Known Allergies  No past medical history on file.  Past Medical History, Surgical history, Social history, and Family history were reviewed and updated as appropriate.   Please see review of systems for further details on the patient's review from today.   Objective:   Physical Exam:  There were no vitals taken for this visit.  Physical Exam Constitutional:      General: She is not in acute distress. Musculoskeletal:        General: No deformity.  Neurological:     Mental Status: She is alert and oriented to person, place, and time.     Coordination: Coordination normal.  Psychiatric:        Attention and Perception: Attention and perception normal. She does not perceive auditory or visual hallucinations.        Mood and Affect: Mood normal. Mood is not anxious or depressed. Affect is not labile, blunt, angry or inappropriate.        Speech: Speech normal.        Behavior: Behavior normal.        Thought Content: Thought content normal. Thought content is not paranoid or delusional. Thought content does not include homicidal or suicidal ideation. Thought content does  not include homicidal or suicidal plan.        Cognition and Memory: Cognition and memory normal.        Judgment: Judgment normal.     Comments: Insight intact    Lab Review:     Component Value Date/Time   NA 138 07/10/2008 1020   K 4.1 07/10/2008 1020   CL 107 07/10/2008 1020   CO2 24 07/10/2008 1020   GLUCOSE 90 07/10/2008 1020   BUN 11 07/10/2008 1020   CREATININE 0.66 07/10/2008 1020   CALCIUM 8.7 07/10/2008 1020   PROT 6.7 07/10/2008 1020   ALBUMIN 3.8 07/10/2008 1020   AST 26 07/10/2008 1020   ALT 29 07/10/2008 1020   ALKPHOS 97 07/10/2008  1020   BILITOT 0.6 07/10/2008 1020   GFRNONAA >60 07/10/2008 1020   GFRAA  07/10/2008 1020    >60        The eGFR has been calculated using the MDRD equation. This calculation has not been validated in all clinical       Component Value Date/Time   WBC 10.3 07/10/2008 1020   RBC 4.51 07/10/2008 1020   HGB 11.7 (L) 07/10/2008 1020   HCT 36.2 07/10/2008 1020   PLT 242 07/10/2008 1020   MCV 80.3 07/10/2008 1020   MCHC 32.2 07/10/2008 1020   RDW 15.6 (H) 07/10/2008 1020   LYMPHSABS 0.8 07/10/2008 1020   MONOABS 0.8 07/10/2008 1020   EOSABS 0.1 07/10/2008 1020   BASOSABS 0.1 07/10/2008 1020    No results found for: POCLITH, LITHIUM   No results found for: PHENYTOIN, PHENOBARB, VALPROATE, CBMZ   .res Assessment: Plan:    Plan:  Cymbalta 38m BID to target depression - taper instructions discussed and written down and given to patient.  Requip 136m- two at bedtime  Adderall XR 2079maily  Met with PCP - started on vitamin supplements  RTC 4 weeks  [place] not been able to see.  Patient advised to contact office with any questions, adverse effects, or acute worsening in signs and symptoms.  Discussed potential benefits, risks, and side effects of stimulants with patient to include increased heart rate, palpitations, insomnia, increased anxiety, increased irritability, or decreased appetite.  Instructed patient to contact office if experiencing any significant tolerability issues.  Discussed potential benefits, risk, and side effects of benzodiazepines to include potential risk of tolerance and dependence, as well as possible drowsiness.  Advised patient not to drive if experiencing drowsiness and to take lowest possible effective dose to minimize risk of dependence and tolerance.  Diagnoses and all orders for this visit:  Major depressive disorder, recurrent episode, moderate (HCC)  Attention deficit hyperactivity disorder (ADHD), combined  type -     amphetamine-dextroamphetamine (ADDERALL XR) 20 MG 24 hr capsule; Take 1 capsule (20 mg total) by mouth daily.  Restless legs syndrome -     rOPINIRole (REQUIP) 1 MG tablet; Take 1 tablet (1 mg total) by mouth at bedtime.  Generalized anxiety disorder    Please see After Visit Summary for patient specific instructions.  No future appointments.  No orders of the defined types were placed in this encounter.   -------------------------------

## 2021-05-22 NOTE — Progress Notes (Deleted)
Tiffany Francis [id number] 07/19/1981 40 y.o.  Subjective:   Patient ID:  Tiffany Francis is a 40 y.o. (DOB 12/03/1980) female.  Chief Complaint: No chief complaint on file.   HPI Ever Halberg presents to the office today for follow-up of ADHD, anxiety, depression, insomnia, and RLS.  Describes mood today as "ok". Pleasant. Tearful at times. Mood symptoms - reports depression, anxiety, and irritability. Denies panic attacks. Denies mood fluctuations - "just more down". Would like to continue tapering off medications - Cymbalta. Discussed recent labs with PCP and no changes were made. Has started OTC vitamin supplementation. Has a trip planned for the weekend - [place] her and son's birthday. Improved interest and motivation. Taking medications as prescribed.    Energy levels low. Active, does not have a regular exercise routine.  Enjoys some usual interests and activities. Married. Lives with husband and son. Spending time with family.  Appetite adequate. Weight stable. Sleeps well most nights. Averages 6 to 7 hours. Focus and concentration stable. Completing tasks. Managing some aspects of household. Works full time for [employer].  Denies SI or HI.  Denies AH or VH.   Review of Systems:  Review of Systems  Musculoskeletal:  Negative for gait problem.  Neurological:  Negative for tremors.  Psychiatric/Behavioral:         Please refer to HPI   Medications: I have reviewed the patient's current medications.  Current Outpatient Medications  Medication Sig Dispense Refill   amphetamine-dextroamphetamine (ADDERALL XR) 20 MG 24 hr capsule Take 1 capsule (20 mg total) by mouth daily. 30 capsule 0   DULoxetine (CYMBALTA) 30 MG capsule Take 1 capsule (30 mg total) by mouth daily. 30 capsule 2   DULoxetine (CYMBALTA) 60 MG capsule TAKE 1 CAPSULE(60 MG) BY MOUTH TWICE DAILY 60 capsule 5   meloxicam (MOBIC) 15 MG tablet      omeprazole (PRILOSEC) 40 MG  capsule      rOPINIRole (REQUIP) 1 MG tablet Take 1 tablet (1 mg total) by mouth at bedtime. 30 tablet 5   tiZANidine (ZANAFLEX) 4 MG tablet TK 1 TO 1 AND 1/2 TS PO UP TO TID PRN     No current facility-administered medications for this visit.    Medication Side Effects: None  Allergies: No Known Allergies  No past medical history on file.  Past Medical History, Surgical history, Social history, and Family history were reviewed and updated as appropriate.   Please see review of systems for further details on the patient's review from today.   Objective:   Physical Exam:  There were no vitals taken for this visit.  Physical Exam Constitutional:      General: She is not in acute distress. Musculoskeletal:        General: No deformity.  Neurological:     Mental Status: She is alert and oriented to person, place, and time.     Coordination: Coordination normal.  Psychiatric:        Attention and Perception: Attention and perception normal. She does not perceive auditory or visual hallucinations.        Mood and Affect: Mood normal. Mood is not anxious or depressed. Affect is not labile, blunt, angry or inappropriate.        Speech: Speech normal.        Behavior: Behavior normal.        Thought Content: Thought content normal. Thought content is not paranoid or delusional. Thought content does not include homicidal  or suicidal ideation. Thought content does not include homicidal or suicidal plan.        Cognition and Memory: Cognition and memory normal.        Judgment: Judgment normal.     Comments: Insight intact    Lab Review:     Component Value Date/Time   NA 138 07/10/2008 1020   K 4.1 07/10/2008 1020   CL 107 07/10/2008 1020   CO2 24 07/10/2008 1020   GLUCOSE 90 07/10/2008 1020   BUN 11 07/10/2008 1020   CREATININE 0.66 07/10/2008 1020   CALCIUM 8.7 07/10/2008 1020   PROT 6.7 07/10/2008 1020   ALBUMIN 3.8 07/10/2008 1020   AST 26 07/10/2008 1020   ALT 29  07/10/2008 1020   ALKPHOS 97 07/10/2008 1020   BILITOT 0.6 07/10/2008 1020   GFRNONAA >60 07/10/2008 1020   GFRAA  07/10/2008 1020    >60        The eGFR has been calculated using the MDRD equation. This calculation has not been validated in all clinical       Component Value Date/Time   WBC 10.3 07/10/2008 1020   RBC 4.51 07/10/2008 1020   HGB 11.7 (L) 07/10/2008 1020   HCT 36.2 07/10/2008 1020   PLT 242 07/10/2008 1020   MCV 80.3 07/10/2008 1020   MCHC 32.2 07/10/2008 1020   RDW 15.6 (H) 07/10/2008 1020   LYMPHSABS 0.8 07/10/2008 1020   MONOABS 0.8 07/10/2008 1020   EOSABS 0.1 07/10/2008 1020   BASOSABS 0.1 07/10/2008 1020    No results found for: POCLITH, LITHIUM   No results found for: PHENYTOIN, PHENOBARB, VALPROATE, CBMZ   .res Assessment: Plan:    Plan:  Cymbalta 91m BID to target depression - taper instructions discussed and written down and given to patient.  Requip 195m- two at bedtime  Adderall XR 2064maily  Met with PCP - started on vitamin supplements  RTC 4 weeks  [place] [facility] - has not been able to see.  Patient advised to contact office with any questions, adverse effects, or acute worsening in signs and symptoms.  Discussed potential benefits, risks, and side effects of stimulants with patient to include increased heart rate, palpitations, insomnia, increased anxiety, increased irritability, or decreased appetite.  Instructed patient to contact office if experiencing any significant tolerability issues.  Discussed potential benefits, risk, and side effects of benzodiazepines to include potential risk of tolerance and dependence, as well as possible drowsiness.  Advised patient not to drive if experiencing drowsiness and to take lowest possible effective dose to minimize risk of dependence and tolerance.  Diagnoses and all orders for this visit:  Major depressive disorder, recurrent episode, moderate (HCC)  Attention  deficit hyperactivity disorder (ADHD), combined type -     amphetamine-dextroamphetamine (ADDERALL XR) 20 MG 24 hr capsule; Take 1 capsule (20 mg total) by mouth daily.  Restless legs syndrome -     rOPINIRole (REQUIP) 1 MG tablet; Take 1 tablet (1 mg total) by mouth at bedtime.  Generalized anxiety disorder    Please see After Visit Summary for patient specific instructions.  No future appointments.  No orders of the defined types were placed in this encounter.   -------------------------------

## 2021-05-22 NOTE — Progress Notes (Deleted)
Tiffany Francis [id number] [birth year] 40 y.o.  Subjective:   Patient ID:  Tiffany Francis is a 40 y.o. (DOB May 08, 1981) female.  Chief Complaint: No chief complaint on file.   HPI [name] presents to the office today for follow-up of ADHD, anxiety, depression, insomnia, and RLS.  Describes mood today as "ok". Pleasant. Tearful at times. Mood symptoms - reports depression, anxiety, and irritability. Denies panic attacks. Denies mood instability. Has tapered off of Cymbalta. Stating "I'm doing ok". Would like to continue with Adderall for now and give it a month before reintroducing another medication. Has been spending more time with oldest son and her 2 grandchildren. Improved interest and motivation. Taking medications as prescribed.  Energy levels lower. Active, does not have a regular exercise routine.  Enjoys some usual interests and activities. Married. Lives with husband and son. Spending time with family.  Appetite adequate. Weight stable. Sleeps well most nights. Averages 6 to 7 hours. Focus and concentration stable. Completing tasks. Managing some aspects of household. Works full time for [employer].  Denies SI or HI.  Denies AH or VH.   Review of Systems:  Review of Systems  Musculoskeletal:  Negative for gait problem.  Neurological:  Negative for tremors.  Psychiatric/Behavioral:         Please refer to HPI   Medications: I have reviewed the patient's current medications.  Current Outpatient Medications  Medication Sig Dispense Refill   amphetamine-dextroamphetamine (ADDERALL XR) 20 MG 24 hr capsule Take 1 capsule (20 mg total) by mouth daily. 30 capsule 0   DULoxetine (CYMBALTA) 30 MG capsule Take 1 capsule (30 mg total) by mouth daily. 30 capsule 2   DULoxetine (CYMBALTA) 60 MG capsule TAKE 1 CAPSULE(60 MG) BY MOUTH TWICE DAILY 60 capsule 5   meloxicam (MOBIC) 15 MG tablet      omeprazole (PRILOSEC) 40 MG capsule      rOPINIRole (REQUIP) 1 MG  tablet Take 1 tablet (1 mg total) by mouth at bedtime. 30 tablet 5   tiZANidine (ZANAFLEX) 4 MG tablet TK 1 TO 1 AND 1/2 TS PO UP TO TID PRN     No current facility-administered medications for this visit.    Medication Side Effects: None  Allergies: No Known Allergies  No past medical history on file.  Past Medical History, Surgical history, Social history, and Family history were reviewed and updated as appropriate.   Please see review of systems for further details on the patient's review from today.   Objective:   Physical Exam:  There were no vitals taken for this visit.  Physical Exam Constitutional:      General: She is not in acute distress. Musculoskeletal:        General: No deformity.  Neurological:     Mental Status: She is alert and oriented to person, place, and time.     Coordination: Coordination normal.  Psychiatric:        Attention and Perception: Attention and perception normal. She does not perceive auditory or visual hallucinations.        Mood and Affect: Mood normal. Mood is not anxious or depressed. Affect is not labile, blunt, angry or inappropriate.        Speech: Speech normal.        Behavior: Behavior normal.        Thought Content: Thought content normal. Thought content is not paranoid or delusional. Thought content does not include homicidal or suicidal ideation. Thought content does not include homicidal  or suicidal plan.        Cognition and Memory: Cognition and memory normal.        Judgment: Judgment normal.     Comments: Insight intact    Lab Review:     Component Value Date/Time   NA 138 07/10/2008 1020   K 4.1 07/10/2008 1020   CL 107 07/10/2008 1020   CO2 24 07/10/2008 1020   GLUCOSE 90 07/10/2008 1020   BUN 11 07/10/2008 1020   CREATININE 0.66 07/10/2008 1020   CALCIUM 8.7 07/10/2008 1020   PROT 6.7 07/10/2008 1020   ALBUMIN 3.8 07/10/2008 1020   AST 26 07/10/2008 1020   ALT 29 07/10/2008 1020   ALKPHOS 97 07/10/2008  1020   BILITOT 0.6 07/10/2008 1020   GFRNONAA >60 07/10/2008 1020   GFRAA  07/10/2008 1020    >60        The eGFR has been calculated using the MDRD equation. This calculation has not been validated in all clinical       Component Value Date/Time   WBC 10.3 07/10/2008 1020   RBC 4.51 07/10/2008 1020   HGB 11.7 (L) 07/10/2008 1020   HCT 36.2 07/10/2008 1020   PLT 242 07/10/2008 1020   MCV 80.3 07/10/2008 1020   MCHC 32.2 07/10/2008 1020   RDW 15.6 (H) 07/10/2008 1020   LYMPHSABS 0.8 07/10/2008 1020   MONOABS 0.8 07/10/2008 1020   EOSABS 0.1 07/10/2008 1020   BASOSABS 0.1 07/10/2008 1020    No results found for: POCLITH, LITHIUM   No results found for: PHENYTOIN, PHENOBARB, VALPROATE, CBMZ   .res Assessment: Plan:    Plan:  Requip 42m - two at bedtime  Adderall XR 21mdaily  Met with PCP - started on vitamin supplements  RTC 4 weeks  [place] therapist - has not been able to see.  Patient advised to contact office with any questions, adverse effects, or acute worsening in signs and symptoms.  Discussed potential benefits, risks, and side effects of stimulants with patient to include increased heart rate, palpitations, insomnia, increased anxiety, increased irritability, or decreased appetite.  Instructed patient to contact office if experiencing any significant tolerability issues.  Discussed potential benefits, risk, and side effects of benzodiazepines to include potential risk of tolerance and dependence, as well as possible drowsiness.  Advised patient not to drive if experiencing drowsiness and to take lowest possible effective dose to minimize risk of dependence and tolerance.  Diagnoses and all orders for this visit:  Major depressive disorder, recurrent episode, moderate (HCC)  Attention deficit hyperactivity disorder (ADHD), combined type -     amphetamine-dextroamphetamine (ADDERALL XR) 20 MG 24 hr capsule; Take 1 capsule (20 mg total) by  mouth daily.  Restless legs syndrome -     rOPINIRole (REQUIP) 1 MG tablet; Take 1 tablet (1 mg total) by mouth at bedtime.  Generalized anxiety disorder    Please see After Visit Summary for patient specific instructions.  No future appointments.  No orders of the defined types were placed in this encounter.   -------------------------------

## 2021-06-24 ENCOUNTER — Ambulatory Visit (INDEPENDENT_AMBULATORY_CARE_PROVIDER_SITE_OTHER): Payer: BC Managed Care – PPO | Admitting: Adult Health

## 2021-06-24 ENCOUNTER — Encounter: Payer: Self-pay | Admitting: Adult Health

## 2021-06-24 ENCOUNTER — Other Ambulatory Visit: Payer: Self-pay

## 2021-06-24 DIAGNOSIS — F331 Major depressive disorder, recurrent, moderate: Secondary | ICD-10-CM

## 2021-06-24 DIAGNOSIS — F902 Attention-deficit hyperactivity disorder, combined type: Secondary | ICD-10-CM

## 2021-06-24 DIAGNOSIS — G2581 Restless legs syndrome: Secondary | ICD-10-CM | POA: Diagnosis not present

## 2021-06-24 DIAGNOSIS — F411 Generalized anxiety disorder: Secondary | ICD-10-CM

## 2021-06-24 DIAGNOSIS — G4700 Insomnia, unspecified: Secondary | ICD-10-CM

## 2021-06-24 MED ORDER — AMPHETAMINE-DEXTROAMPHET ER 20 MG PO CP24: 20.0000 mg | ORAL_CAPSULE | Freq: Every day | ORAL | 0 refills | Status: DC

## 2021-06-24 MED ORDER — DESVENLAFAXINE SUCCINATE ER 50 MG PO TB24: 50.0000 mg | ORAL_TABLET | Freq: Every day | ORAL | 5 refills | Status: DC

## 2021-06-24 NOTE — Progress Notes (Signed)
Tiffany Francis [id number] Sep 06, 1980 40 y.o.  Subjective:   Patient ID:  Tiffany Francis is a 40 y.o. (DOB 10-Nov-1980) female.  Chief Complaint: No chief complaint on file.   HPI Tiffany Francis presents to the office today for follow-up of ADHD, anxiety, depression, insomnia, and RLS.   Describes mood today as "ok". Pleasant. Tearful at times. Mood symptoms - reports depression and anxiety. Feels more depressed overall. Feels irritable at times - "I have my moments". Denies panic attacks. Denies mood instability. Stating "I feel like I can control my mood swings". Increased stressors - more so within the family. Not feeling as stressed in the work setting. Has tapered off of all medications - except Adderall for focus and concentration. Varying interest and motivation. Taking medications as prescribed. Energy levels lower. Active, does not have a regular exercise routine.  Enjoys some usual interests and activities. Married. Lives with husband and son. Spending time with family.  Appetite adequate. Weight stable. Sleeps well most nights. Averages 6 to 7 hours. Focus and concentration stable. Completing tasks. Managing some aspects of household. Works full time for [employer].  Denies SI or HI.  Denies AH or VH.  Topamax for seizures.   Review of Systems:  Review of Systems  Musculoskeletal:  Negative for gait problem.  Neurological:  Negative for tremors.  Psychiatric/Behavioral:         Please refer to HPI   Medications: I have reviewed the patient's current medications.  Current Outpatient Medications  Medication Sig Dispense Refill   amphetamine-dextroamphetamine (ADDERALL XR) 20 MG 24 hr capsule Take 1 capsule (20 mg total) by mouth daily. 30 capsule 0   DULoxetine (CYMBALTA) 30 MG capsule Take 1 capsule (30 mg total) by mouth daily. 30 capsule 2   DULoxetine (CYMBALTA) 60 MG capsule TAKE 1 CAPSULE(60 MG) BY MOUTH TWICE DAILY 60 capsule 5   halobetasol (ULTRAVATE)  0.05 % cream Apply topically at bedtime.     meloxicam (MOBIC) 15 MG tablet      omeprazole (PRILOSEC) 40 MG capsule      rizatriptan (MAXALT) 10 MG tablet Take by mouth.     rOPINIRole (REQUIP) 1 MG tablet Take 1 tablet (1 mg total) by mouth at bedtime. 30 tablet 5   tiZANidine (ZANAFLEX) 4 MG tablet TK 1 TO 1 AND 1/2 TS PO UP TO TID PRN     topiramate (TOPAMAX) 100 MG tablet Take 100 mg by mouth 2 (two) times daily.     No current facility-administered medications for this visit.    Medication Side Effects: None  Allergies: No Known Allergies  No past medical history on file.  Past Medical History, Surgical history, Social history, and Family history were reviewed and updated as appropriate.   Please see review of systems for further details on the patient's review from today.   Objective:   Physical Exam:  There were no vitals taken for this visit.  Physical Exam Constitutional:      General: She is not in acute distress. Musculoskeletal:        General: No deformity.  Neurological:     Mental Status: She is alert and oriented to person, place, and time.     Coordination: Coordination normal.  Psychiatric:        Attention and Perception: Attention and perception normal. She does not perceive auditory or visual hallucinations.        Mood and Affect: Mood normal. Mood is not anxious or depressed. Affect is  not labile, blunt, angry or inappropriate.        Speech: Speech normal.        Behavior: Behavior normal.        Thought Content: Thought content normal. Thought content is not paranoid or delusional. Thought content does not include homicidal or suicidal ideation. Thought content does not include homicidal or suicidal plan.        Cognition and Memory: Cognition and memory normal.        Judgment: Judgment normal.     Comments: Insight intact    Lab Review:     Component Value Date/Time   NA 138 07/10/2008 1020   K 4.1 07/10/2008 1020   CL 107 07/10/2008 1020    CO2 24 07/10/2008 1020   GLUCOSE 90 07/10/2008 1020   BUN 11 07/10/2008 1020   CREATININE 0.66 07/10/2008 1020   CALCIUM 8.7 07/10/2008 1020   PROT 6.7 07/10/2008 1020   ALBUMIN 3.8 07/10/2008 1020   AST 26 07/10/2008 1020   ALT 29 07/10/2008 1020   ALKPHOS 97 07/10/2008 1020   BILITOT 0.6 07/10/2008 1020   GFRNONAA >60 07/10/2008 1020   GFRAA  07/10/2008 1020    >60        The eGFR has been calculated using the MDRD equation. This calculation has not been validated in all clinical       Component Value Date/Time   WBC 10.3 07/10/2008 1020   RBC 4.51 07/10/2008 1020   HGB 11.7 (L) 07/10/2008 1020   HCT 36.2 07/10/2008 1020   PLT 242 07/10/2008 1020   MCV 80.3 07/10/2008 1020   MCHC 32.2 07/10/2008 1020   RDW 15.6 (H) 07/10/2008 1020   LYMPHSABS 0.8 07/10/2008 1020   MONOABS 0.8 07/10/2008 1020   EOSABS 0.1 07/10/2008 1020   BASOSABS 0.1 07/10/2008 1020    No results found for: POCLITH, LITHIUM   No results found for: PHENYTOIN, PHENOBARB, VALPROATE, CBMZ   .res Assessment: Plan:    Plan:  Requip 51m - two at bedtime  Adderall XR 210mdaily  Add Pristiq 5041mvery morning  Topamax for seizures.  Met with PCP - started on vitamin supplements  RTC 4 weeks  [place] - has not been able to see.  Patient advised to contact office with any questions, adverse effects, or acute worsening in signs and symptoms.  Discussed potential benefits, risks, and side effects of stimulants with patient to include increased heart rate, palpitations, insomnia, increased anxiety, increased irritability, or decreased appetite.  Instructed patient to contact office if experiencing any significant tolerability issues. There are no diagnoses linked to this encounter.   Please see After Visit Summary for patient specific instructions.  No future appointments.  No orders of the defined types were placed in this  encounter.   -------------------------------

## 2021-07-23 ENCOUNTER — Other Ambulatory Visit: Payer: Self-pay

## 2021-07-23 ENCOUNTER — Telehealth: Payer: Self-pay | Admitting: Adult Health

## 2021-07-23 DIAGNOSIS — F902 Attention-deficit hyperactivity disorder, combined type: Secondary | ICD-10-CM

## 2021-07-23 MED ORDER — AMPHETAMINE-DEXTROAMPHET ER 20 MG PO CP24: 20.0000 mg | ORAL_CAPSULE | Freq: Every day | ORAL | 0 refills | Status: DC

## 2021-07-23 NOTE — Telephone Encounter (Signed)
Pended.

## 2021-07-23 NOTE — Telephone Encounter (Signed)
Next visit is 07/30/21. Requesting Adderall 20 mg called to:  [id number] - [place], [place] - [address] AT [facility] [address] [facility] [address]  Phone:  [phone]  Fax:  [phone]    Maryalice checked and they do have Adderall 20 mg in stock. Pharmacy requested that the refill be called in by noon in hopes that they don't run out.

## 2021-07-30 ENCOUNTER — Other Ambulatory Visit: Payer: Self-pay

## 2021-07-30 ENCOUNTER — Ambulatory Visit (INDEPENDENT_AMBULATORY_CARE_PROVIDER_SITE_OTHER): Payer: BC Managed Care – PPO | Admitting: Adult Health

## 2021-07-30 ENCOUNTER — Encounter: Payer: Self-pay | Admitting: Adult Health

## 2021-07-30 DIAGNOSIS — G2581 Restless legs syndrome: Secondary | ICD-10-CM

## 2021-07-30 DIAGNOSIS — F902 Attention-deficit hyperactivity disorder, combined type: Secondary | ICD-10-CM | POA: Diagnosis not present

## 2021-07-30 DIAGNOSIS — F411 Generalized anxiety disorder: Secondary | ICD-10-CM | POA: Diagnosis not present

## 2021-07-30 DIAGNOSIS — G4700 Insomnia, unspecified: Secondary | ICD-10-CM

## 2021-07-30 DIAGNOSIS — F331 Major depressive disorder, recurrent, moderate: Secondary | ICD-10-CM

## 2021-07-30 MED ORDER — AMPHETAMINE-DEXTROAMPHET ER 20 MG PO CP24
20.0000 mg | ORAL_CAPSULE | Freq: Every day | ORAL | 0 refills | Status: DC
Start: 2021-07-30 — End: 2021-08-27

## 2021-07-30 MED ORDER — ALPRAZOLAM 1 MG PO TABS: 1.0000 mg | ORAL_TABLET | Freq: Three times a day (TID) | ORAL | 2 refills | Status: DC | PRN

## 2021-07-30 MED ORDER — DESVENLAFAXINE SUCCINATE ER 100 MG PO TB24
100.0000 mg | ORAL_TABLET | Freq: Every day | ORAL | 5 refills | Status: DC
Start: 2021-07-30 — End: 2021-11-18

## 2021-07-30 NOTE — Progress Notes (Signed)
Tiffany Francis [id number] [birth year] 40 y.o.  Subjective:   Patient ID:  Tiffany Francis is a 40 y.o. (DOB 09/11/1980) female.  Chief Complaint: No chief complaint on file.   HPI Tiffany Francis presents to the office today for follow-up of ADHD, anxiety, depression, insomnia, and RLS.   Describes mood today as "ok". Pleasant. Tearful at times. Mood symptoms - reports depression, irritability and anxiety. Reports panic attacks. Denies mood instability. Stating "I'm having a tough time". Grieving loss of grandmother - passed away a week ago - funeral yesterday. Increased pain issues. Varying interest and motivation. Taking medications as prescribed. Energy levels lower. Active, does not have a regular exercise routine.  Enjoys some usual interests and activities. Married. Lives with husband and son. Spending time with family.  Appetite adequate. Weight stable. Sleeps well most nights. Averages 6 to 7 hours. Focus and concentration stable. Completing tasks. Managing some aspects of household. Works full time for [employer].  Denies SI or HI.  Denies AH or VH.  Topamax for seizures.   Review of Systems:  Review of Systems  Musculoskeletal:  Negative for gait problem.  Neurological:  Negative for tremors.  Psychiatric/Behavioral:         Please refer to HPI   Medications: I have reviewed the patient's current medications.  Current Outpatient Medications  Medication Sig Dispense Refill   amphetamine-dextroamphetamine (ADDERALL XR) 20 MG 24 hr capsule Take 1 capsule (20 mg total) by mouth daily. 30 capsule 0   desvenlafaxine (PRISTIQ) 50 MG 24 hr tablet Take 1 tablet (50 mg total) by mouth daily. 30 tablet 5   halobetasol (ULTRAVATE) 0.05 % cream Apply topically at bedtime.     meloxicam (MOBIC) 15 MG tablet      omeprazole (PRILOSEC) 40 MG capsule      rizatriptan (MAXALT) 10 MG tablet Take by mouth.     rOPINIRole (REQUIP) 1 MG tablet Take 1 tablet (1 mg total) by mouth  at bedtime. 30 tablet 5   tiZANidine (ZANAFLEX) 4 MG tablet TK 1 TO 1 AND 1/2 TS PO UP TO TID PRN     topiramate (TOPAMAX) 100 MG tablet Take 100 mg by mouth 2 (two) times daily.     No current facility-administered medications for this visit.    Medication Side Effects: None  Allergies: No Known Allergies  No past medical history on file.  Past Medical History, Surgical history, Social history, and Family history were reviewed and updated as appropriate.   Please see review of systems for further details on the patient's review from today.   Objective:   Physical Exam:  There were no vitals taken for this visit.  Physical Exam Constitutional:      General: She is not in acute distress. Musculoskeletal:        General: No deformity.  Neurological:     Mental Status: She is alert and oriented to person, place, and time.     Coordination: Coordination normal.  Psychiatric:        Attention and Perception: Attention and perception normal. She does not perceive auditory or visual hallucinations.        Mood and Affect: Mood normal. Mood is not anxious or depressed. Affect is not labile, blunt, angry or inappropriate.        Speech: Speech normal.        Behavior: Behavior normal.        Thought Content: Thought content normal. Thought content is not paranoid or delusional.  Thought content does not include homicidal or suicidal ideation. Thought content does not include homicidal or suicidal plan.        Cognition and Memory: Cognition and memory normal.        Judgment: Judgment normal.     Comments: Insight intact    Lab Review:     Component Value Date/Time   NA 138 07/10/2008 1020   K 4.1 07/10/2008 1020   CL 107 07/10/2008 1020   CO2 24 07/10/2008 1020   GLUCOSE 90 07/10/2008 1020   BUN 11 07/10/2008 1020   CREATININE 0.66 07/10/2008 1020   CALCIUM 8.7 07/10/2008 1020   PROT 6.7 07/10/2008 1020   ALBUMIN 3.8 07/10/2008 1020   AST 26 07/10/2008 1020   ALT 29  07/10/2008 1020   ALKPHOS 97 07/10/2008 1020   BILITOT 0.6 07/10/2008 1020   GFRNONAA >60 07/10/2008 1020   GFRAA  07/10/2008 1020    >60        The eGFR has been calculated using the MDRD equation. This calculation has not been validated in all clinical       Component Value Date/Time   WBC 10.3 07/10/2008 1020   RBC 4.51 07/10/2008 1020   HGB 11.7 (L) 07/10/2008 1020   HCT 36.2 07/10/2008 1020   PLT 242 07/10/2008 1020   MCV 80.3 07/10/2008 1020   MCHC 32.2 07/10/2008 1020   RDW 15.6 (H) 07/10/2008 1020   LYMPHSABS 0.8 07/10/2008 1020   MONOABS 0.8 07/10/2008 1020   EOSABS 0.1 07/10/2008 1020   BASOSABS 0.1 07/10/2008 1020    No results found for: POCLITH, LITHIUM   No results found for: PHENYTOIN, PHENOBARB, VALPROATE, CBMZ   .res Assessment: Plan:    Plan:  Requip 19m - two at bedtime  Adderall XR 247mdaily Restart Xanax 68m46mID Increase Pristiq 93m52m 100mg28mry morning  Topamax for seizures.  Met with PCP - started on vitamin supplements  RTC 4 weeks  [facility] - has not been able to see.  Patient advised to contact office with any questions, adverse effects, or acute worsening in signs and symptoms.  Discussed potential benefits, risks, and side effects of stimulants with patient to include increased heart rate, palpitations, insomnia, increased anxiety, increased irritability, or decreased appetite.  Instructed patient to contact office if experiencing any significant tolerability issues. There are no diagnoses linked to this encounter.  There are no diagnoses linked to this encounter.   Please see After Visit Summary for patient specific instructions.  No future appointments.  No orders of the defined types were placed in this encounter.   -------------------------------

## 2021-08-27 ENCOUNTER — Encounter: Payer: Self-pay | Admitting: Adult Health

## 2021-08-27 ENCOUNTER — Other Ambulatory Visit: Payer: Self-pay

## 2021-08-27 ENCOUNTER — Ambulatory Visit (INDEPENDENT_AMBULATORY_CARE_PROVIDER_SITE_OTHER): Payer: BC Managed Care – PPO | Admitting: Adult Health

## 2021-08-27 DIAGNOSIS — F411 Generalized anxiety disorder: Secondary | ICD-10-CM | POA: Diagnosis not present

## 2021-08-27 DIAGNOSIS — G2581 Restless legs syndrome: Secondary | ICD-10-CM | POA: Diagnosis not present

## 2021-08-27 DIAGNOSIS — F902 Attention-deficit hyperactivity disorder, combined type: Secondary | ICD-10-CM

## 2021-08-27 DIAGNOSIS — F331 Major depressive disorder, recurrent, moderate: Secondary | ICD-10-CM

## 2021-08-27 DIAGNOSIS — G4700 Insomnia, unspecified: Secondary | ICD-10-CM

## 2021-08-27 MED ORDER — AMPHETAMINE-DEXTROAMPHET ER 20 MG PO CP24: 20.0000 mg | ORAL_CAPSULE | Freq: Every day | ORAL | 0 refills | Status: DC

## 2021-08-27 NOTE — Progress Notes (Signed)
Tiffany Francis [id number] 11/01/1980 41 y.o.  Subjective:   Patient ID:  Tiffany Francis is a 41 y.o. (DOB January 29, 1981) female.  Chief Complaint: No chief complaint on file.   HPI Tiffany Francis presents to the office today for follow-up of ADHD, anxiety, depression, insomnia, and RLS.   Describes mood today as "ok". Pleasant. Tearful at times. Mood symptoms - reports decreased depression, irritability and anxiety. Reports decreased panic attacks. Denies mood instability. Stating "I feel like the medications are helpful". Reports increased stressors. Has asked son and his family to move out - "too much conflict". Continues to grieve loss of grandmother. Pain has been "better" - recently got a toradol shot. Varying interest and motivation. Taking medications as prescribed. Energy levels improved. Active, does not have a regular exercise routine.  Enjoys some usual interests and activities. Married. Lives with husband and son. Spending time with family.  Appetite adequate. Weight stable. Sleeps well most nights. Averages 6 to 7 hours. Focus and concentration stable. Completing tasks. Managing some aspects of household. Works full time for [employer].  Denies SI or HI.  Denies AH or VH.  Taking Topamax for seizures.    Review of Systems:  Review of Systems  Musculoskeletal:  Negative for gait problem.  Neurological:  Negative for tremors.  Psychiatric/Behavioral:         Please refer to HPI   Medications: I have reviewed the patient's current medications.  Current Outpatient Medications  Medication Sig Dispense Refill   ALPRAZolam (XANAX) 1 MG tablet Take 1 tablet (1 mg total) by mouth 3 (three) times daily as needed for anxiety. 90 tablet 2   amphetamine-dextroamphetamine (ADDERALL XR) 20 MG 24 hr capsule Take 1 capsule (20 mg total) by mouth daily. 30 capsule 0   desvenlafaxine (PRISTIQ) 100 MG 24 hr tablet Take 1 tablet (100 mg total) by mouth daily. 30 tablet 5    halobetasol (ULTRAVATE) 0.05 % cream Apply topically at bedtime.     meloxicam (MOBIC) 15 MG tablet      omeprazole (PRILOSEC) 40 MG capsule      rizatriptan (MAXALT) 10 MG tablet Take by mouth.     rOPINIRole (REQUIP) 1 MG tablet Take 1 tablet (1 mg total) by mouth at bedtime. 30 tablet 5   tiZANidine (ZANAFLEX) 4 MG tablet TK 1 TO 1 AND 1/2 TS PO UP TO TID PRN     topiramate (TOPAMAX) 100 MG tablet Take 100 mg by mouth 2 (two) times daily.     No current facility-administered medications for this visit.    Medication Side Effects: None  Allergies: No Known Allergies  No past medical history on file.  Past Medical History, Surgical history, Social history, and Family history were reviewed and updated as appropriate.   Please see review of systems for further details on the patient's review from today.   Objective:   Physical Exam:  There were no vitals taken for this visit.  Physical Exam Constitutional:      General: She is not in acute distress. Musculoskeletal:        General: No deformity.  Neurological:     Mental Status: She is alert and oriented to person, place, and time.     Coordination: Coordination normal.  Psychiatric:        Attention and Perception: Attention and perception normal. She does not perceive auditory or visual hallucinations.        Mood and Affect: Mood normal. Mood is not anxious or  depressed. Affect is not labile, blunt, angry or inappropriate.        Speech: Speech normal.        Behavior: Behavior normal.        Thought Content: Thought content normal. Thought content is not paranoid or delusional. Thought content does not include homicidal or suicidal ideation. Thought content does not include homicidal or suicidal plan.        Cognition and Memory: Cognition and memory normal.        Judgment: Judgment normal.     Comments: Insight intact    Lab Review:     Component Value Date/Time   NA 138 07/10/2008 1020   K 4.1 07/10/2008 1020    CL 107 07/10/2008 1020   CO2 24 07/10/2008 1020   GLUCOSE 90 07/10/2008 1020   BUN 11 07/10/2008 1020   CREATININE 0.66 07/10/2008 1020   CALCIUM 8.7 07/10/2008 1020   PROT 6.7 07/10/2008 1020   ALBUMIN 3.8 07/10/2008 1020   AST 26 07/10/2008 1020   ALT 29 07/10/2008 1020   ALKPHOS 97 07/10/2008 1020   BILITOT 0.6 07/10/2008 1020   GFRNONAA >60 07/10/2008 1020   GFRAA  07/10/2008 1020    >60        The eGFR has been calculated using the MDRD equation. This calculation has not been validated in all clinical       Component Value Date/Time   WBC 10.3 07/10/2008 1020   RBC 4.51 07/10/2008 1020   HGB 11.7 (L) 07/10/2008 1020   HCT 36.2 07/10/2008 1020   PLT 242 07/10/2008 1020   MCV 80.3 07/10/2008 1020   MCHC 32.2 07/10/2008 1020   RDW 15.6 (H) 07/10/2008 1020   LYMPHSABS 0.8 07/10/2008 1020   MONOABS 0.8 07/10/2008 1020   EOSABS 0.1 07/10/2008 1020   BASOSABS 0.1 07/10/2008 1020    No results found for: POCLITH, LITHIUM   No results found for: PHENYTOIN, PHENOBARB, VALPROATE, CBMZ   .res Assessment: Plan:    Plan:  Requip 85m - two at bedtime  Adderall XR 273mdaily Xanax 59m40mID taking mostly at night Pristiq 100m53mery morning  Taking vitamins - D3 and B12  Topamax for seizures.  RTC 4 weeks  [facility] - has not been able to see.  Patient advised to contact office with any questions, adverse effects, or acute worsening in signs and symptoms.  Discussed potential benefits, risks, and side effects of stimulants with patient to include increased heart rate, palpitations, insomnia, increased anxiety, increased irritability, or decreased appetite.  Instructed patient to contact office if experiencing any significant tolerability issues. Diagnoses and all orders for this visit:  Major depressive disorder, recurrent episode, moderate (HCC)  Attention deficit hyperactivity disorder (ADHD), combined type -      amphetamine-dextroamphetamine (ADDERALL XR) 20 MG 24 hr capsule; Take 1 capsule (20 mg total) by mouth daily.  Generalized anxiety disorder  Restless legs syndrome  Insomnia, unspecified type     Please see After Visit Summary for patient specific instructions.  No future appointments.  No orders of the defined types were placed in this encounter.   -------------------------------

## 2021-09-23 ENCOUNTER — Encounter: Payer: Self-pay | Admitting: Adult Health

## 2021-09-23 ENCOUNTER — Other Ambulatory Visit: Payer: Self-pay

## 2021-09-23 ENCOUNTER — Ambulatory Visit (INDEPENDENT_AMBULATORY_CARE_PROVIDER_SITE_OTHER): Payer: BC Managed Care – PPO | Admitting: Adult Health

## 2021-09-23 DIAGNOSIS — F331 Major depressive disorder, recurrent, moderate: Secondary | ICD-10-CM

## 2021-09-23 DIAGNOSIS — G2581 Restless legs syndrome: Secondary | ICD-10-CM | POA: Diagnosis not present

## 2021-09-23 DIAGNOSIS — F902 Attention-deficit hyperactivity disorder, combined type: Secondary | ICD-10-CM

## 2021-09-23 DIAGNOSIS — F411 Generalized anxiety disorder: Secondary | ICD-10-CM | POA: Diagnosis not present

## 2021-09-23 DIAGNOSIS — G4700 Insomnia, unspecified: Secondary | ICD-10-CM

## 2021-09-23 MED ORDER — ROPINIROLE HCL 1 MG PO TABS: 1.0000 mg | ORAL_TABLET | Freq: Every day | ORAL | 5 refills | Status: DC

## 2021-09-23 MED ORDER — AMPHETAMINE-DEXTROAMPHET ER 20 MG PO CP24: 20.0000 mg | ORAL_CAPSULE | Freq: Every day | ORAL | 0 refills | Status: DC

## 2021-09-23 NOTE — Progress Notes (Signed)
Tiffany Francis [id number] 09/12/1980 41 y.o.  Subjective:   Patient ID:  Tiffany Francis is a 41 y.o. (DOB Dec 29, 1980) female.  Chief Complaint: No chief complaint on file.   HPI Tiffany Francis presents to the office today for follow-up of ADHD, anxiety, depression, insomnia, and RLS.   Describes mood today as "ok". Pleasant. Tearful at times. Mood symptoms - reports decreased depression, irritability and anxiety. Reports decreased panic attacks. Denies mood instability. Feels like the medications are helpful. Son and his family family recently moved out - living with her mother. Stating "that was stressful". Suffers from chronic pain. Varying interest and motivation. Taking medications as prescribed. Energy levels improved. Active, does not have a regular exercise routine.  Enjoys some usual interests and activities. Married. Lives with husband and son. Spending time with family.  Appetite adequate. Weight stable. Sleeps well most nights. Averages 6 to 7 hours. Focus and concentration stable. Completing tasks. Managing some aspects of household. Works full time for [employer].  Denies SI or HI.  Denies AH or VH.  Taking Topamax for seizures.  Review of Systems:  Review of Systems  Musculoskeletal:  Negative for gait problem.  Neurological:  Negative for tremors.  Psychiatric/Behavioral:         Please refer to HPI   Medications: I have reviewed the patient's current medications.  Current Outpatient Medications  Medication Sig Dispense Refill   ALPRAZolam (XANAX) 1 MG tablet Take 1 tablet (1 mg total) by mouth 3 (three) times daily as needed for anxiety. 90 tablet 2   amphetamine-dextroamphetamine (ADDERALL XR) 20 MG 24 hr capsule Take 1 capsule (20 mg total) by mouth daily. 30 capsule 0   desvenlafaxine (PRISTIQ) 100 MG 24 hr tablet Take 1 tablet (100 mg total) by mouth daily. 30 tablet 5   halobetasol (ULTRAVATE) 0.05 % cream Apply topically at bedtime.      meloxicam (MOBIC) 15 MG tablet      omeprazole (PRILOSEC) 40 MG capsule      rizatriptan (MAXALT) 10 MG tablet Take by mouth.     rOPINIRole (REQUIP) 1 MG tablet Take 1 tablet (1 mg total) by mouth at bedtime. 30 tablet 5   tiZANidine (ZANAFLEX) 4 MG tablet TK 1 TO 1 AND 1/2 TS PO UP TO TID PRN     topiramate (TOPAMAX) 100 MG tablet Take 100 mg by mouth 2 (two) times daily.     No current facility-administered medications for this visit.    Medication Side Effects: None  Allergies: No Known Allergies  No past medical history on file.  Past Medical History, Surgical history, Social history, and Family history were reviewed and updated as appropriate.   Please see review of systems for further details on the patient's review from today.   Objective:   Physical Exam:  There were no vitals taken for this visit.  Physical Exam Constitutional:      General: She is not in acute distress. Musculoskeletal:        General: No deformity.  Neurological:     Mental Status: She is alert and oriented to person, place, and time.     Coordination: Coordination normal.  Psychiatric:        Attention and Perception: Attention and perception normal. She does not perceive auditory or visual hallucinations.        Mood and Affect: Mood normal. Mood is not anxious or depressed. Affect is not labile, blunt, angry or inappropriate.  Speech: Speech normal.        Behavior: Behavior normal.        Thought Content: Thought content normal. Thought content is not paranoid or delusional. Thought content does not include homicidal or suicidal ideation. Thought content does not include homicidal or suicidal plan.        Cognition and Memory: Cognition and memory normal.        Judgment: Judgment normal.     Comments: Insight intact    Lab Review:     Component Value Date/Time   NA 138 07/10/2008 1020   K 4.1 07/10/2008 1020   CL 107 07/10/2008 1020   CO2 24 07/10/2008 1020   GLUCOSE 90  07/10/2008 1020   BUN 11 07/10/2008 1020   CREATININE 0.66 07/10/2008 1020   CALCIUM 8.7 07/10/2008 1020   PROT 6.7 07/10/2008 1020   ALBUMIN 3.8 07/10/2008 1020   AST 26 07/10/2008 1020   ALT 29 07/10/2008 1020   ALKPHOS 97 07/10/2008 1020   BILITOT 0.6 07/10/2008 1020   GFRNONAA >60 07/10/2008 1020   GFRAA  07/10/2008 1020    >60        The eGFR has been calculated using the MDRD equation. This calculation has not been validated in all clinical       Component Value Date/Time   WBC 10.3 07/10/2008 1020   RBC 4.51 07/10/2008 1020   HGB 11.7 (L) 07/10/2008 1020   HCT 36.2 07/10/2008 1020   PLT 242 07/10/2008 1020   MCV 80.3 07/10/2008 1020   MCHC 32.2 07/10/2008 1020   RDW 15.6 (H) 07/10/2008 1020   LYMPHSABS 0.8 07/10/2008 1020   MONOABS 0.8 07/10/2008 1020   EOSABS 0.1 07/10/2008 1020   BASOSABS 0.1 07/10/2008 1020    No results found for: POCLITH, LITHIUM   No results found for: PHENYTOIN, PHENOBARB, VALPROATE, CBMZ   .res Assessment: Plan:    Plan:  Requip 39m - two at bedtime  Adderall XR 29mdaily Xanax 25m90mID taking mostly at night Pristiq 100m15mery morning  Taking vitamins - D3 and B12  Topamax for seizures.  RTC 4 weeks  [facility] - has not been able to see.  Patient advised to contact office with any questions, adverse effects, or acute worsening in signs and symptoms.  Discussed potential benefits, risks, and side effects of stimulants with patient to include increased heart rate, palpitations, insomnia, increased anxiety, increased irritability, or decreased appetite.  Instructed patient to contact office if experiencing any significant tolerability issues.  Diagnoses and all orders for this visit:  Major depressive disorder, recurrent episode, moderate (HCC)  Attention deficit hyperactivity disorder (ADHD), combined type -     amphetamine-dextroamphetamine (ADDERALL XR) 20 MG 24 hr capsule; Take 1 capsule (20 mg  total) by mouth daily.  Restless legs syndrome -     rOPINIRole (REQUIP) 1 MG tablet; Take 1 tablet (1 mg total) by mouth at bedtime.  Generalized anxiety disorder  Insomnia, unspecified type     Please see After Visit Summary for patient specific instructions.  No future appointments.  No orders of the defined types were placed in this encounter.   -------------------------------

## 2021-11-12 ENCOUNTER — Other Ambulatory Visit: Payer: Self-pay | Admitting: Adult Health

## 2021-11-12 DIAGNOSIS — F411 Generalized anxiety disorder: Secondary | ICD-10-CM

## 2021-11-12 MED ORDER — FLUOXETINE HCL 20 MG PO CAPS: 20.0000 mg | ORAL_CAPSULE | Freq: Every day | ORAL | 3 refills | Status: DC

## 2021-11-18 ENCOUNTER — Ambulatory Visit (INDEPENDENT_AMBULATORY_CARE_PROVIDER_SITE_OTHER): Payer: BC Managed Care – PPO | Admitting: Adult Health

## 2021-11-18 ENCOUNTER — Encounter: Payer: Self-pay | Admitting: Adult Health

## 2021-11-18 DIAGNOSIS — F331 Major depressive disorder, recurrent, moderate: Secondary | ICD-10-CM | POA: Diagnosis not present

## 2021-11-18 DIAGNOSIS — F902 Attention-deficit hyperactivity disorder, combined type: Secondary | ICD-10-CM | POA: Diagnosis not present

## 2021-11-18 DIAGNOSIS — F411 Generalized anxiety disorder: Secondary | ICD-10-CM

## 2021-11-18 DIAGNOSIS — G2581 Restless legs syndrome: Secondary | ICD-10-CM

## 2021-11-18 MED ORDER — AMPHETAMINE-DEXTROAMPHET ER 20 MG PO CP24: 20.0000 mg | ORAL_CAPSULE | Freq: Every day | ORAL | 0 refills | Status: DC

## 2021-11-18 MED ORDER — DESVENLAFAXINE SUCCINATE ER 100 MG PO TB24: 100.0000 mg | ORAL_TABLET | Freq: Every day | ORAL | 5 refills | Status: DC

## 2021-11-18 MED ORDER — ALPRAZOLAM 1 MG PO TABS: 1.0000 mg | ORAL_TABLET | Freq: Three times a day (TID) | ORAL | 2 refills | Status: DC | PRN

## 2021-11-18 MED ORDER — ROPINIROLE HCL 1 MG PO TABS: 1.0000 mg | ORAL_TABLET | Freq: Every day | ORAL | 5 refills | Status: DC

## 2021-11-18 NOTE — Progress Notes (Signed)
Tiffany Francis ?[id number] ?05-21-1981 ?41 y.o. ? ?Subjective:  ? ?Patient ID:  Tiffany Francis is a 41 y.o. (DOB 1981-03-06) female. ? ?Chief Complaint: No chief complaint on file. ? ? ?HPI ?Arnold Depinto presents to the office today for follow-up of ADHD, anxiety, depression, insomnia, and RLS. ?  ?Describes mood today as "ok". Pleasant. Tearful at times. Mood symptoms - reports some depression, irritability and anxiety. Reports panic attacks. Denies mood instability. Increased situational stressors related to son - having mental health issues. Feels like the medications are helpful. Suffers from chronic pain. Varying interest and motivation. Taking medications as prescribed. ?Energy levels improved. Active, does not have a regular exercise routine.  ?Enjoys some usual interests and activities. Married. Lives with husband and son. Spending time with family.  ?Appetite adequate. Weight stable. ?Sleeps well most nights. Averages 6 to 7 hours. ?Focus and concentration stable. Completing tasks. Managing some aspects of household. Works full time for [employer].  ?Denies SI or HI.  ?Denies AH or VH. ? ?Taking Topamax for seizures. ? ? ?  ? ?Review of Systems:  ?Review of Systems  ?Musculoskeletal:  Negative for gait problem.  ?Neurological:  Negative for tremors.  ?Psychiatric/Behavioral:    ?     Please refer to HPI  ? ?Medications: I have reviewed the patient's current medications. ? ?Current Outpatient Medications  ?Medication Sig Dispense Refill  ? ALPRAZolam (XANAX) 1 MG tablet Take 1 tablet (1 mg total) by mouth 3 (three) times daily as needed for anxiety. 90 tablet 2  ? amphetamine-dextroamphetamine (ADDERALL XR) 20 MG 24 hr capsule Take 1 capsule (20 mg total) by mouth daily. 30 capsule 0  ? desvenlafaxine (PRISTIQ) 100 MG 24 hr tablet Take 1 tablet (100 mg total) by mouth daily. 30 tablet 5  ? FLUoxetine (PROZAC) 20 MG capsule Take 1 capsule (20 mg total) by mouth daily. 30 capsule 3  ? halobetasol  (ULTRAVATE) 0.05 % cream Apply topically at bedtime.    ? meloxicam (MOBIC) 15 MG tablet     ? omeprazole (PRILOSEC) 40 MG capsule     ? rizatriptan (MAXALT) 10 MG tablet Take by mouth.    ? rOPINIRole (REQUIP) 1 MG tablet Take 1 tablet (1 mg total) by mouth at bedtime. 30 tablet 5  ? tiZANidine (ZANAFLEX) 4 MG tablet TK 1 TO 1 AND 1/2 TS PO UP TO TID PRN    ? topiramate (TOPAMAX) 100 MG tablet Take 100 mg by mouth 2 (two) times daily.    ? ?No current facility-administered medications for this visit.  ? ? ?Medication Side Effects: None ? ?Allergies: No Known Allergies ? ?No past medical history on file. ? ?Past Medical History, Surgical history, Social history, and Family history were reviewed and updated as appropriate.  ? ?Please see review of systems for further details on the patient's review from today.  ? ?Objective:  ? ?Physical Exam:  ?There were no vitals taken for this visit. ? ?Physical Exam ?Constitutional:   ?   General: She is not in acute distress. ?Musculoskeletal:     ?   General: No deformity.  ?Neurological:  ?   Mental Status: She is alert and oriented to person, place, and time.  ?   Coordination: Coordination normal.  ?Psychiatric:     ?   Attention and Perception: Attention and perception normal. She does not perceive auditory or visual hallucinations.     ?   Mood and Affect: Mood normal. Mood is not anxious  or depressed. Affect is not labile, blunt, angry or inappropriate.     ?   Speech: Speech normal.     ?   Behavior: Behavior normal.     ?   Thought Content: Thought content normal. Thought content is not paranoid or delusional. Thought content does not include homicidal or suicidal ideation. Thought content does not include homicidal or suicidal plan.     ?   Cognition and Memory: Cognition and memory normal.     ?   Judgment: Judgment normal.  ?   Comments: Insight intact  ? ? ?Lab Review:  ?   ?Component Value Date/Time  ? NA 138 07/10/2008 1020  ? K 4.1 07/10/2008 1020  ? CL 107  07/10/2008 1020  ? CO2 24 07/10/2008 1020  ? GLUCOSE 90 07/10/2008 1020  ? BUN 11 07/10/2008 1020  ? CREATININE 0.66 07/10/2008 1020  ? CALCIUM 8.7 07/10/2008 1020  ? PROT 6.7 07/10/2008 1020  ? ALBUMIN 3.8 07/10/2008 1020  ? AST 26 07/10/2008 1020  ? ALT 29 07/10/2008 1020  ? ALKPHOS 97 07/10/2008 1020  ? BILITOT 0.6 07/10/2008 1020  ? GFRNONAA >60 07/10/2008 1020  ? GFRAA  07/10/2008 1020  ?  >60        ?The eGFR has been calculated ?using the MDRD equation. ?This calculation has not been ?validated in all clinical  ? ? ?   ?Component Value Date/Time  ? WBC 10.3 07/10/2008 1020  ? RBC 4.51 07/10/2008 1020  ? HGB 11.7 (L) 07/10/2008 1020  ? HCT 36.2 07/10/2008 1020  ? PLT 242 07/10/2008 1020  ? MCV 80.3 07/10/2008 1020  ? MCHC 32.2 07/10/2008 1020  ? RDW 15.6 (H) 07/10/2008 1020  ? LYMPHSABS 0.8 07/10/2008 1020  ? MONOABS 0.8 07/10/2008 1020  ? EOSABS 0.1 07/10/2008 1020  ? BASOSABS 0.1 07/10/2008 1020  ? ? ?No results found for: POCLITH, LITHIUM  ? ?No results found for: PHENYTOIN, PHENOBARB, VALPROATE, CBMZ  ? ?.res ?Assessment: Plan:   ? ?Plan: ? ?Requip 17m - two at bedtime  ?Adderall XR 245mdaily ?Xanax 94m19mID taking mostly at night ?Pristiq 100m80mery morning ?Prozac 20mg33mly - may leave off ? ?Taking vitamins - D3 and B12 ? ?Plans to start seeing a therapist at family [facility]. ? ?Topamax for seizures. ? ?RTC 4 weeks ? ?Patient advised to contact office with any questions, adverse effects, or acute worsening in signs and symptoms. ? ?Discussed potential benefits, risks, and side effects of stimulants with patient to include increased heart rate, palpitations, insomnia, increased anxiety, increased irritability, or decreased appetite.  Instructed patient to contact office if experiencing any significant tolerability issues. ? ?Diagnoses and all orders for this visit: ? ?Generalized anxiety disorder ?-     ALPRAZolam (XANAX) 1 MG tablet; Take 1 tablet (1 mg total) by mouth 3 (three) times daily as needed  for anxiety. ?-     desvenlafaxine (PRISTIQ) 100 MG 24 hr tablet; Take 1 tablet (100 mg total) by mouth daily. ? ?Attention deficit hyperactivity disorder (ADHD), combined type ?-     amphetamine-dextroamphetamine (ADDERALL XR) 20 MG 24 hr capsule; Take 1 capsule (20 mg total) by mouth daily. ? ?Major depressive disorder, recurrent episode, moderate (HCC) ?-     desvenlafaxine (PRISTIQ) 100 MG 24 hr tablet; Take 1 tablet (100 mg total) by mouth daily. ? ?Restless legs syndrome ?-     rOPINIRole (REQUIP) 1 MG tablet; Take 1 tablet (1 mg total) by mouth at  bedtime. ? ?  ? ?Please see After Visit Summary for patient specific instructions. ? ?Future Appointments  ?Date Time Provider [place]  ?12/24/2021  5:20 PM Cyprian Gongaware, Berdie Ogren, NP [facility] None  ? ? ? ?No orders of the defined types were placed in this encounter. ? ? ?------------------------------- ?

## 2021-12-24 ENCOUNTER — Encounter: Payer: Self-pay | Admitting: Adult Health

## 2021-12-24 ENCOUNTER — Ambulatory Visit (INDEPENDENT_AMBULATORY_CARE_PROVIDER_SITE_OTHER): Payer: BC Managed Care – PPO | Admitting: Adult Health

## 2021-12-24 DIAGNOSIS — F902 Attention-deficit hyperactivity disorder, combined type: Secondary | ICD-10-CM | POA: Diagnosis not present

## 2021-12-24 DIAGNOSIS — F331 Major depressive disorder, recurrent, moderate: Secondary | ICD-10-CM

## 2021-12-24 DIAGNOSIS — F411 Generalized anxiety disorder: Secondary | ICD-10-CM | POA: Diagnosis not present

## 2021-12-24 DIAGNOSIS — G2581 Restless legs syndrome: Secondary | ICD-10-CM

## 2021-12-24 DIAGNOSIS — G4700 Insomnia, unspecified: Secondary | ICD-10-CM

## 2021-12-24 MED ORDER — FLUOXETINE HCL 40 MG PO CAPS: 40.0000 mg | ORAL_CAPSULE | Freq: Every day | ORAL | 2 refills | Status: DC

## 2021-12-24 NOTE — Addendum Note (Signed)
Addended by: Aloha Gell on: 12/24/2021 05:57 PM   Modules accepted: Orders

## 2021-12-24 NOTE — Progress Notes (Addendum)
Tiffany Francis [id number] [birth year] 41 y.o.  Subjective:   Patient ID:  Tiffany Francis is a 41 y.o. (DOB 10/02/1980) female.  Chief Complaint: No chief complaint on file.   HPI [name] presents to the office today for follow-up of ADHD, anxiety, depression, insomnia, and RLS.   Describes mood today as "ok". Pleasant. Tearful. Mood symptoms - reports depression, irritability and anxiety. Reports panic attacks. Denies mood instability. Increased situational stressors surrounding son. Feels like the medications are helpful. Reports chronic pain issues. Varying interest and motivation. Taking medications as prescribed. Energy levels improved. Active, does not have a regular exercise routine.  Enjoys some usual interests and activities. Married. Lives with husband and son. Spending time with family.  Appetite adequate. Weight stable. Sleeps well most nights. Averages 6 hours. Focus and concentration stable. Completing tasks. Managing some aspects of household. Works full time for [employer].  Denies SI or HI.  Denies AH or VH.  Taking Topamax for seizures.  Review of Systems:  Review of Systems  Musculoskeletal:  Negative for gait problem.  Neurological:  Negative for tremors.  Psychiatric/Behavioral:         Please refer to HPI   Medications: I have reviewed the patient's current medications.  Current Outpatient Medications  Medication Sig Dispense Refill   ALPRAZolam (XANAX) 1 MG tablet Take 1 tablet (1 mg total) by mouth 3 (three) times daily as needed for anxiety. 90 tablet 2   amphetamine-dextroamphetamine (ADDERALL XR) 20 MG 24 hr capsule Take 1 capsule (20 mg total) by mouth daily. 30 capsule 0   desvenlafaxine (PRISTIQ) 100 MG 24 hr tablet Take 1 tablet (100 mg total) by mouth daily. 30 tablet 5   FLUoxetine (PROZAC) 20 MG capsule Take 1 capsule (20 mg total) by mouth daily. 30 capsule 3   halobetasol (ULTRAVATE) 0.05 % cream Apply topically at bedtime.      meloxicam (MOBIC) 15 MG tablet      omeprazole (PRILOSEC) 40 MG capsule      rizatriptan (MAXALT) 10 MG tablet Take by mouth.     rOPINIRole (REQUIP) 1 MG tablet Take 1 tablet (1 mg total) by mouth at bedtime. 30 tablet 5   tiZANidine (ZANAFLEX) 4 MG tablet TK 1 TO 1 AND 1/2 TS PO UP TO TID PRN     topiramate (TOPAMAX) 100 MG tablet Take 100 mg by mouth 2 (two) times daily.     No current facility-administered medications for this visit.    Medication Side Effects: None  Allergies: No Known Allergies  No past medical history on file.  Past Medical History, Surgical history, Social history, and Family history were reviewed and updated as appropriate.   Please see review of systems for further details on the patient's review from today.   Objective:   Physical Exam:  There were no vitals taken for this visit.  Physical Exam Constitutional:      General: She is not in acute distress. Musculoskeletal:        General: No deformity.  Neurological:     Mental Status: She is alert and oriented to person, place, and time.     Coordination: Coordination normal.  Psychiatric:        Attention and Perception: Attention and perception normal. She does not perceive auditory or visual hallucinations.        Mood and Affect: Mood normal. Mood is not anxious or depressed. Affect is not labile, blunt, angry or inappropriate.  Speech: Speech normal.        Behavior: Behavior normal.        Thought Content: Thought content normal. Thought content is not paranoid or delusional. Thought content does not include homicidal or suicidal ideation. Thought content does not include homicidal or suicidal plan.        Cognition and Memory: Cognition and memory normal.        Judgment: Judgment normal.     Comments: Insight intact    Lab Review:     Component Value Date/Time   NA 138 07/10/2008 1020   K 4.1 07/10/2008 1020   CL 107 07/10/2008 1020   CO2 24 07/10/2008 1020   GLUCOSE 90  07/10/2008 1020   BUN 11 07/10/2008 1020   CREATININE 0.66 07/10/2008 1020   CALCIUM 8.7 07/10/2008 1020   PROT 6.7 07/10/2008 1020   ALBUMIN 3.8 07/10/2008 1020   AST 26 07/10/2008 1020   ALT 29 07/10/2008 1020   ALKPHOS 97 07/10/2008 1020   BILITOT 0.6 07/10/2008 1020   GFRNONAA >60 07/10/2008 1020   GFRAA  07/10/2008 1020    >60        The eGFR has been calculated using the MDRD equation. This calculation has not been validated in all clinical       Component Value Date/Time   WBC 10.3 07/10/2008 1020   RBC 4.51 07/10/2008 1020   HGB 11.7 (L) 07/10/2008 1020   HCT 36.2 07/10/2008 1020   PLT 242 07/10/2008 1020   MCV 80.3 07/10/2008 1020   MCHC 32.2 07/10/2008 1020   RDW 15.6 (H) 07/10/2008 1020   LYMPHSABS 0.8 07/10/2008 1020   MONOABS 0.8 07/10/2008 1020   EOSABS 0.1 07/10/2008 1020   BASOSABS 0.1 07/10/2008 1020    No results found for: POCLITH, LITHIUM   No results found for: PHENYTOIN, PHENOBARB, VALPROATE, CBMZ   .res Assessment: Plan:    Plan:  Requip 73m - two at bedtime  Adderall XR 268mdaily Xanax 19m719mID taking mostly at night Pristiq 100m72mery morning  Prozac 20mg48making two daily   Taking vitamins - D3 and B12  Plans to start seeing a therapist at [facility].  Topamax for seizures.  RTC 4 weeks  Patient advised to contact office with any questions, adverse effects, or acute worsening in signs and symptoms.  Discussed potential benefits, risks, and side effects of stimulants with patient to include increased heart rate, palpitations, insomnia, increased anxiety, increased irritability, or decreased appetite.  Instructed patient to contact office if experiencing any significant tolerability issues. Diagnoses and all orders for this visit:  Generalized anxiety disorder  Attention deficit hyperactivity disorder (ADHD), combined type  Major depressive disorder, recurrent episode, moderate (HCC)  Restless legs  syndrome  Insomnia, unspecified type     Please see After Visit Summary for patient specific instructions.  No future appointments.  No orders of the defined types were placed in this encounter.   -------------------------------

## 2022-01-21 ENCOUNTER — Encounter: Payer: Self-pay | Admitting: Adult Health

## 2022-01-21 ENCOUNTER — Ambulatory Visit (INDEPENDENT_AMBULATORY_CARE_PROVIDER_SITE_OTHER): Payer: BC Managed Care – PPO | Admitting: Adult Health

## 2022-01-21 DIAGNOSIS — F902 Attention-deficit hyperactivity disorder, combined type: Secondary | ICD-10-CM | POA: Diagnosis not present

## 2022-01-21 DIAGNOSIS — G4700 Insomnia, unspecified: Secondary | ICD-10-CM

## 2022-01-21 DIAGNOSIS — G2581 Restless legs syndrome: Secondary | ICD-10-CM

## 2022-01-21 DIAGNOSIS — F331 Major depressive disorder, recurrent, moderate: Secondary | ICD-10-CM

## 2022-01-21 DIAGNOSIS — F411 Generalized anxiety disorder: Secondary | ICD-10-CM | POA: Diagnosis not present

## 2022-01-21 MED ORDER — ALPRAZOLAM 1 MG PO TABS: 1.0000 mg | ORAL_TABLET | Freq: Three times a day (TID) | ORAL | 2 refills | Status: DC | PRN

## 2022-01-21 NOTE — Progress Notes (Signed)
Tiffany Francis [id number] 06/12/1981 41 y.o.  Subjective:   Patient ID:  Tiffany Francis is a 41 y.o. (DOB 02/01/1981) female.  Chief Complaint: No chief complaint on file.   HPI Amel Windmiller presents to the office today for follow-up of ADHD, anxiety, depression, insomnia, and RLS.   Describes mood today as "ok". Pleasant. Tearful. Mood symptoms - reports decreased depression, irritability and anxiety - "a little bit less". Reports worry and rumination. Reports panic attacks - less. Denies mood instability. Ongoing situational stressors - mostly related to son's mental health issues. Feels like the medications are helpful. Varying interest and motivation. Taking medications as prescribed. Energy levels improved. Active, does not have a regular exercise routine.  Enjoys some usual interests and activities. Married. Lives with husband and son. Spending time with family.  Appetite adequate. Weight loss - 215 pounds. Sleeps well most nights. Averages 6 hours. Focus and concentration stable. Completing tasks. Managing some aspects of household. Works full time for [place] [employer].  Denies SI or HI.  Denies AH or VH.  Seeing a therapist.  Reports chronic pain issues.  Taking Topamax for seizures.   Review of Systems:  Review of Systems  Musculoskeletal:  Negative for gait problem.  Neurological:  Negative for tremors.  Psychiatric/Behavioral:         Please refer to HPI    Medications: I have reviewed the patient's current medications.  Current Outpatient Medications  Medication Sig Dispense Refill   ALPRAZolam (XANAX) 1 MG tablet Take 1 tablet (1 mg total) by mouth 3 (three) times daily as needed for anxiety. 90 tablet 2   amphetamine-dextroamphetamine (ADDERALL XR) 20 MG 24 hr capsule Take 1 capsule (20 mg total) by mouth daily. 30 capsule 0   desvenlafaxine (PRISTIQ) 100 MG 24 hr tablet Take 1 tablet (100 mg total) by mouth daily. 30 tablet 5   FLUoxetine (PROZAC) 40  MG capsule Take 1 capsule (40 mg total) by mouth daily. 30 capsule 2   halobetasol (ULTRAVATE) 0.05 % cream Apply topically at bedtime.     meloxicam (MOBIC) 15 MG tablet      omeprazole (PRILOSEC) 40 MG capsule      rizatriptan (MAXALT) 10 MG tablet Take by mouth.     rOPINIRole (REQUIP) 1 MG tablet Take 1 tablet (1 mg total) by mouth at bedtime. 30 tablet 5   tiZANidine (ZANAFLEX) 4 MG tablet TK 1 TO 1 AND 1/2 TS PO UP TO TID PRN     topiramate (TOPAMAX) 100 MG tablet Take 100 mg by mouth 2 (two) times daily.     No current facility-administered medications for this visit.    Medication Side Effects: None  Allergies: No Known Allergies  No past medical history on file.  Past Medical History, Surgical history, Social history, and Family history were reviewed and updated as appropriate.   Please see review of systems for further details on the patient's review from today.   Objective:   Physical Exam:  There were no vitals taken for this visit.  Physical Exam Constitutional:      General: She is not in acute distress. Musculoskeletal:        General: No deformity.  Neurological:     Mental Status: She is alert and oriented to person, place, and time.     Coordination: Coordination normal.  Psychiatric:        Attention and Perception: Attention and perception normal. She does not perceive auditory or visual hallucinations.          Mood and Affect: Mood normal. Mood is not anxious or depressed. Affect is not labile, blunt, angry or inappropriate.        Speech: Speech normal.        Behavior: Behavior normal.        Thought Content: Thought content normal. Thought content is not paranoid or delusional. Thought content does not include homicidal or suicidal ideation. Thought content does not include homicidal or suicidal plan.        Cognition and Memory: Cognition and memory normal.        Judgment: Judgment normal.     Comments: Insight intact     Lab Review:      Component Value Date/Time   NA 138 07/10/2008 1020   K 4.1 07/10/2008 1020   CL 107 07/10/2008 1020   CO2 24 07/10/2008 1020   GLUCOSE 90 07/10/2008 1020   BUN 11 07/10/2008 1020   CREATININE 0.66 07/10/2008 1020   CALCIUM 8.7 07/10/2008 1020   PROT 6.7 07/10/2008 1020   ALBUMIN 3.8 07/10/2008 1020   AST 26 07/10/2008 1020   ALT 29 07/10/2008 1020   ALKPHOS 97 07/10/2008 1020   BILITOT 0.6 07/10/2008 1020   GFRNONAA >60 07/10/2008 1020   GFRAA  07/10/2008 1020    >60        The eGFR has been calculated using the MDRD equation. This calculation has not been validated in all clinical       Component Value Date/Time   WBC 10.3 07/10/2008 1020   RBC 4.51 07/10/2008 1020   HGB 11.7 (L) 07/10/2008 1020   HCT 36.2 07/10/2008 1020   PLT 242 07/10/2008 1020   MCV 80.3 07/10/2008 1020   MCHC 32.2 07/10/2008 1020   RDW 15.6 (H) 07/10/2008 1020   LYMPHSABS 0.8 07/10/2008 1020   MONOABS 0.8 07/10/2008 1020   EOSABS 0.1 07/10/2008 1020   BASOSABS 0.1 07/10/2008 1020    No results found for: "POCLITH", "LITHIUM"   No results found for: "PHENYTOIN", "PHENOBARB", "VALPROATE", "CBMZ"   .res Assessment: Plan:    Plan:  Requip 1mg - two at bedtime  Adderall XR 20mg daily Xanax 1mg TID taking mostly at night Pristiq 100mg every morning Prozac 20mg - taking two daily   Monitor BP between visits   109/67/63  Taking vitamins - D3 and B12  RTC 4 weeks  Patient advised to contact office with any questions, adverse effects, or acute worsening in signs and symptoms.  Discussed potential benefits, risks, and side effects of stimulants with patient to include increased heart rate, palpitations, insomnia, increased anxiety, increased irritability, or decreased appetite.  Instructed patient to contact office if experiencing any significant tolerability issues.  Diagnoses and all orders for this visit:  Attention deficit hyperactivity disorder (ADHD), combined  type  Generalized anxiety disorder -     ALPRAZolam (XANAX) 1 MG tablet; Take 1 tablet (1 mg total) by mouth 3 (three) times daily as needed for anxiety.  Major depressive disorder, recurrent episode, moderate (HCC)  Restless legs syndrome  Insomnia, unspecified type     Please see After Visit Summary for patient specific instructions.  No future appointments.  No orders of the defined types were placed in this encounter.   ------------------------------- 

## 2022-02-18 ENCOUNTER — Encounter: Payer: Self-pay | Admitting: Adult Health

## 2022-02-18 ENCOUNTER — Ambulatory Visit (INDEPENDENT_AMBULATORY_CARE_PROVIDER_SITE_OTHER): Payer: BC Managed Care – PPO | Admitting: Adult Health

## 2022-02-18 DIAGNOSIS — G2581 Restless legs syndrome: Secondary | ICD-10-CM

## 2022-02-18 DIAGNOSIS — F411 Generalized anxiety disorder: Secondary | ICD-10-CM

## 2022-02-18 DIAGNOSIS — F902 Attention-deficit hyperactivity disorder, combined type: Secondary | ICD-10-CM | POA: Diagnosis not present

## 2022-02-18 DIAGNOSIS — F909 Attention-deficit hyperactivity disorder, unspecified type: Secondary | ICD-10-CM

## 2022-02-18 DIAGNOSIS — F331 Major depressive disorder, recurrent, moderate: Secondary | ICD-10-CM | POA: Diagnosis not present

## 2022-02-18 DIAGNOSIS — G4700 Insomnia, unspecified: Secondary | ICD-10-CM

## 2022-02-18 MED ORDER — AMPHETAMINE-DEXTROAMPHET ER 20 MG PO CP24: 20.0000 mg | ORAL_CAPSULE | Freq: Every day | ORAL | 0 refills | Status: DC

## 2022-02-18 MED ORDER — FLUOXETINE HCL 40 MG PO CAPS: 40.0000 mg | ORAL_CAPSULE | Freq: Every day | ORAL | 2 refills | Status: DC

## 2022-02-18 NOTE — Progress Notes (Signed)
Tiffany Francis [id number] 1980/12/20 41 y.o.  Subjective:   Patient ID:  Tiffany Francis is a 41 y.o. (DOB 04/14/1981) female.  Chief Complaint: No chief complaint on file.   HPI Tiffany Francis presents to the office today for follow-up of ADHD, anxiety, depression, insomnia, and RLS.   Describes mood today as "ok". Pleasant. Tearful. Mood symptoms - reports increased depression, irritability and anxiety. Reports worry and rumination. Reports panic attacks. Denies mood instability. Reports ongoing situational stressors - family related. Feels like the medications are helpful. Varying interest and motivation. Taking medications as prescribed. Energy levels lower. Active, does not have a regular exercise routine.  Enjoys some usual interests and activities. Married. Lives with husband and son. Spending time with family.  Appetite adequate. Weight loss - 215 pounds. Sleeps better some nights than others. Averages 6 hours. Focus and concentration stable. Completing tasks. Managing some aspects of household. Works full time for [employer].  Denies SI or HI.  Denies AH or VH.  Seeing a therapist.  Reports chronic pain issues.  Taking Topamax for seizures.      Review of Systems:  Review of Systems  Musculoskeletal:  Negative for gait problem.  Neurological:  Negative for tremors.  Psychiatric/Behavioral:         Please refer to HPI    Medications: I have reviewed the patient's current medications.  Current Outpatient Medications  Medication Sig Dispense Refill   ALPRAZolam (XANAX) 1 MG tablet Take 1 tablet (1 mg total) by mouth 3 (three) times daily as needed for anxiety. 90 tablet 2   amphetamine-dextroamphetamine (ADDERALL XR) 20 MG 24 hr capsule Take 1 capsule (20 mg total) by mouth daily. 30 capsule 0   desvenlafaxine (PRISTIQ) 100 MG 24 hr tablet Take 1 tablet (100 mg total) by mouth daily. 30 tablet 5   FLUoxetine (PROZAC) 40 MG capsule Take 1 capsule (40 mg  total) by mouth daily. 30 capsule 2   halobetasol (ULTRAVATE) 0.05 % cream Apply topically at bedtime.     meloxicam (MOBIC) 15 MG tablet      omeprazole (PRILOSEC) 40 MG capsule      rizatriptan (MAXALT) 10 MG tablet Take by mouth.     rOPINIRole (REQUIP) 1 MG tablet Take 1 tablet (1 mg total) by mouth at bedtime. 30 tablet 5   tiZANidine (ZANAFLEX) 4 MG tablet TK 1 TO 1 AND 1/2 TS PO UP TO TID PRN     topiramate (TOPAMAX) 100 MG tablet Take 100 mg by mouth 2 (two) times daily.     No current facility-administered medications for this visit.    Medication Side Effects: None  Allergies: No Known Allergies  No past medical history on file.  Past Medical History, Surgical history, Social history, and Family history were reviewed and updated as appropriate.   Please see review of systems for further details on the patient's review from today.   Objective:   Physical Exam:  There were no vitals taken for this visit.  Physical Exam Constitutional:      General: She is not in acute distress. Musculoskeletal:        General: No deformity.  Neurological:     Mental Status: She is alert and oriented to person, place, and time.     Coordination: Coordination normal.  Psychiatric:        Attention and Perception: Attention and perception normal. She does not perceive auditory or visual hallucinations.        Mood and  Affect: Mood normal. Mood is not anxious or depressed. Affect is not labile, blunt, angry or inappropriate.        Speech: Speech normal.        Behavior: Behavior normal.        Thought Content: Thought content normal. Thought content is not paranoid or delusional. Thought content does not include homicidal or suicidal ideation. Thought content does not include homicidal or suicidal plan.        Cognition and Memory: Cognition and memory normal.        Judgment: Judgment normal.     Comments: Insight intact     Lab Review:     Component Value Date/Time   NA 138  07/10/2008 1020   K 4.1 07/10/2008 1020   CL 107 07/10/2008 1020   CO2 24 07/10/2008 1020   GLUCOSE 90 07/10/2008 1020   BUN 11 07/10/2008 1020   CREATININE 0.66 07/10/2008 1020   CALCIUM 8.7 07/10/2008 1020   PROT 6.7 07/10/2008 1020   ALBUMIN 3.8 07/10/2008 1020   AST 26 07/10/2008 1020   ALT 29 07/10/2008 1020   ALKPHOS 97 07/10/2008 1020   BILITOT 0.6 07/10/2008 1020   GFRNONAA >60 07/10/2008 1020   GFRAA  07/10/2008 1020    >60        The eGFR has been calculated using the MDRD equation. This calculation has not been validated in all clinical       Component Value Date/Time   WBC 10.3 07/10/2008 1020   RBC 4.51 07/10/2008 1020   HGB 11.7 (L) 07/10/2008 1020   HCT 36.2 07/10/2008 1020   PLT 242 07/10/2008 1020   MCV 80.3 07/10/2008 1020   MCHC 32.2 07/10/2008 1020   RDW 15.6 (H) 07/10/2008 1020   LYMPHSABS 0.8 07/10/2008 1020   MONOABS 0.8 07/10/2008 1020   EOSABS 0.1 07/10/2008 1020   BASOSABS 0.1 07/10/2008 1020    No results found for: "POCLITH", "LITHIUM"   No results found for: "PHENYTOIN", "PHENOBARB", "VALPROATE", "CBMZ"   .res Assessment: Plan:    Plan:  Requip 73m - two at bedtime  Adderall XR 2171mdaily Xanax 71m64mID taking mostly at night Pristiq 100m45mery morning Prozac 40mg971mly   Labs - Ferritin level  Monitor BP between visits   RTC 4 weeks  Patient advised to contact office with any questions, adverse effects, or acute worsening in signs and symptoms.  Discussed potential benefits, risks, and side effects of stimulants with patient to include increased heart rate, palpitations, insomnia, increased anxiety, increased irritability, or decreased appetite.  Instructed patient to contact office if experiencing any significant tolerability issues.  Diagnoses and all orders for this visit:  Generalized anxiety disorder -     FLUoxetine (PROZAC) 40 MG capsule; Take 1 capsule (40 mg total) by mouth daily.  Attention deficit  hyperactivity disorder (ADHD), combined type -     amphetamine-dextroamphetamine (ADDERALL XR) 20 MG 24 hr capsule; Take 1 capsule (20 mg total) by mouth daily.  Major depressive disorder, recurrent episode, moderate (HCC)  Attention deficit hyperactivity disorder (ADHD), unspecified ADHD type  Insomnia, unspecified type  Restless legs syndrome     Please see After Visit Summary for patient specific instructions.  No future appointments.  No orders of the defined types were placed in this encounter.   -------------------------------

## 2022-03-19 ENCOUNTER — Telehealth: Payer: Self-pay | Admitting: Adult Health

## 2022-03-19 ENCOUNTER — Other Ambulatory Visit: Payer: Self-pay

## 2022-03-19 MED ORDER — AMPHETAMINE-DEXTROAMPHETAMINE 20 MG PO TABS: 20.0000 mg | ORAL_TABLET | Freq: Every day | ORAL | 0 refills | Status: DC

## 2022-03-19 NOTE — Telephone Encounter (Signed)
Adderall IR

## 2022-03-19 NOTE — Telephone Encounter (Signed)
Pt will check with pharmacy.

## 2022-03-19 NOTE — Telephone Encounter (Signed)
Please advise alternative

## 2022-03-19 NOTE — Telephone Encounter (Signed)
Pt called out of generic adderall XR 20 mg for few weeks. Called around can't find. Pharmacy ask Pt to call and see if Provider will try an other med. Contact Pt work # [phone] cell # [phone].

## 2022-03-23 ENCOUNTER — Ambulatory Visit: Payer: BC Managed Care – PPO | Admitting: Adult Health

## 2022-03-26 ENCOUNTER — Ambulatory Visit: Payer: BC Managed Care – PPO | Admitting: Adult Health

## 2022-04-13 ENCOUNTER — Encounter: Payer: Self-pay | Admitting: Adult Health

## 2022-04-13 ENCOUNTER — Ambulatory Visit (INDEPENDENT_AMBULATORY_CARE_PROVIDER_SITE_OTHER): Payer: BLUE CROSS/BLUE SHIELD | Admitting: Adult Health

## 2022-04-13 DIAGNOSIS — F331 Major depressive disorder, recurrent, moderate: Secondary | ICD-10-CM

## 2022-04-13 DIAGNOSIS — F902 Attention-deficit hyperactivity disorder, combined type: Secondary | ICD-10-CM | POA: Diagnosis not present

## 2022-04-13 DIAGNOSIS — F411 Generalized anxiety disorder: Secondary | ICD-10-CM

## 2022-04-13 DIAGNOSIS — G2581 Restless legs syndrome: Secondary | ICD-10-CM

## 2022-04-13 DIAGNOSIS — G4700 Insomnia, unspecified: Secondary | ICD-10-CM

## 2022-04-13 MED ORDER — ROPINIROLE HCL 1 MG PO TABS: ORAL_TABLET | ORAL | 5 refills | Status: DC

## 2022-04-13 MED ORDER — AMPHETAMINE-DEXTROAMPHET ER 30 MG PO CP24: 30.0000 mg | ORAL_CAPSULE | Freq: Every day | ORAL | 0 refills | Status: DC

## 2022-04-13 MED ORDER — DESVENLAFAXINE SUCCINATE ER 100 MG PO TB24: 100.0000 mg | ORAL_TABLET | Freq: Every day | ORAL | 5 refills | Status: DC

## 2022-04-13 MED ORDER — ALPRAZOLAM 1 MG PO TABS: 1.0000 mg | ORAL_TABLET | Freq: Three times a day (TID) | ORAL | 2 refills | Status: DC | PRN

## 2022-04-13 MED ORDER — FLUOXETINE HCL 40 MG PO CAPS: 40.0000 mg | ORAL_CAPSULE | Freq: Every day | ORAL | 5 refills | Status: DC

## 2022-04-13 NOTE — Progress Notes (Signed)
Tiffany Francis [id number] [birth year] 41 y.o.  Subjective:   Patient ID:  Tiffany Francis is a 41 y.o. (DOB 1981/02/28) female.  Chief Complaint: No chief complaint on file.   HPI Tiffany Francis presents to the office today for follow-up of ADHD, anxiety, depression, insomnia, and RLS.   Describes mood today as "ok". Pleasant. Tearful. Mood symptoms - reports increased depression, irritability and anxiety. Reports worry and rumination. Reports panic attacks. Reports mood instability. Reports increased situational stressors - recently lost her job, concerned about her son, family stressors. Feels like the medications are helpful. Varying interest and motivation. Taking medications as prescribed. Energy levels lower. Active, does not have a regular exercise routine.  Enjoys some usual interests and activities. Married. Lives with husband and son. Spending time with family.  Appetite adequate. Weight loss - 215 pounds. Sleeps better some nights than others. Averages 6 hours. Focus and concentration stable. Completing tasks. Managing some aspects of household. Currently unemployed - recent lost her job at the [employer].  Denies SI or HI.  Denies AH or VH.  Seeing a therapist.  Reports chronic pain issues.  Taking Topamax for seizures.   Review of Systems:  Review of Systems  Musculoskeletal:  Negative for gait problem.  Neurological:  Negative for tremors.  Psychiatric/Behavioral:         Please refer to HPI    Medications: I have reviewed the patient's current medications.  Current Outpatient Medications  Medication Sig Dispense Refill   ALPRAZolam (XANAX) 1 MG tablet Take 1 tablet (1 mg total) by mouth 3 (three) times daily as needed for anxiety. 90 tablet 2   amphetamine-dextroamphetamine (ADDERALL XR) 20 MG 24 hr capsule Take 1 capsule (20 mg total) by mouth daily. 30 capsule 0   amphetamine-dextroamphetamine (ADDERALL) 20 MG tablet Take 1 tablet (20 mg total) by  mouth daily. 30 tablet 0   desvenlafaxine (PRISTIQ) 100 MG 24 hr tablet Take 1 tablet (100 mg total) by mouth daily. 30 tablet 5   FLUoxetine (PROZAC) 40 MG capsule Take 1 capsule (40 mg total) by mouth daily. 30 capsule 2   halobetasol (ULTRAVATE) 0.05 % cream Apply topically at bedtime.     meloxicam (MOBIC) 15 MG tablet      omeprazole (PRILOSEC) 40 MG capsule      rizatriptan (MAXALT) 10 MG tablet Take by mouth.     rOPINIRole (REQUIP) 1 MG tablet Take 1 tablet (1 mg total) by mouth at bedtime. 30 tablet 5   tiZANidine (ZANAFLEX) 4 MG tablet TK 1 TO 1 AND 1/2 TS PO UP TO TID PRN     topiramate (TOPAMAX) 100 MG tablet Take 100 mg by mouth 2 (two) times daily.     No current facility-administered medications for this visit.    Medication Side Effects: None  Allergies: No Known Allergies  No past medical history on file.  Past Medical History, Surgical history, Social history, and Family history were reviewed and updated as appropriate.   Please see review of systems for further details on the patient's review from today.   Objective:   Physical Exam:  There were no vitals taken for this visit.  Physical Exam Constitutional:      General: She is not in acute distress. Musculoskeletal:        General: No deformity.  Neurological:     Mental Status: She is alert and oriented to person, place, and time.     Coordination: Coordination normal.  Psychiatric:  Attention and Perception: Attention and perception normal. She does not perceive auditory or visual hallucinations.        Mood and Affect: Mood normal. Mood is not anxious or depressed. Affect is not labile, blunt, angry or inappropriate.        Speech: Speech normal.        Behavior: Behavior normal.        Thought Content: Thought content normal. Thought content is not paranoid or delusional. Thought content does not include homicidal or suicidal ideation. Thought content does not include homicidal or suicidal plan.         Cognition and Memory: Cognition and memory normal.        Judgment: Judgment normal.     Comments: Insight intact     Lab Review:     Component Value Date/Time   NA 138 07/10/2008 1020   K 4.1 07/10/2008 1020   CL 107 07/10/2008 1020   CO2 24 07/10/2008 1020   GLUCOSE 90 07/10/2008 1020   BUN 11 07/10/2008 1020   CREATININE 0.66 07/10/2008 1020   CALCIUM 8.7 07/10/2008 1020   PROT 6.7 07/10/2008 1020   ALBUMIN 3.8 07/10/2008 1020   AST 26 07/10/2008 1020   ALT 29 07/10/2008 1020   ALKPHOS 97 07/10/2008 1020   BILITOT 0.6 07/10/2008 1020   GFRNONAA >60 07/10/2008 1020   GFRAA  07/10/2008 1020    >60        The eGFR has been calculated using the MDRD equation. This calculation has not been validated in all clinical       Component Value Date/Time   WBC 10.3 07/10/2008 1020   RBC 4.51 07/10/2008 1020   HGB 11.7 (L) 07/10/2008 1020   HCT 36.2 07/10/2008 1020   PLT 242 07/10/2008 1020   MCV 80.3 07/10/2008 1020   MCHC 32.2 07/10/2008 1020   RDW 15.6 (H) 07/10/2008 1020   LYMPHSABS 0.8 07/10/2008 1020   MONOABS 0.8 07/10/2008 1020   EOSABS 0.1 07/10/2008 1020   BASOSABS 0.1 07/10/2008 1020    No results found for: "POCLITH", "LITHIUM"   No results found for: "PHENYTOIN", "PHENOBARB", "VALPROATE", "CBMZ"   .res Assessment: Plan:    Plan:  Requip 58m to 1.513mat bedtime  Adderall XR 2051mo 60m58mily Xanax 1mg 61m taking mostly at night Pristiq 100mg 33my morning Prozac 40mg d60m   Monitor BP between visits   RTC 4 weeks  Patient advised to contact office with any questions, adverse effects, or acute worsening in signs and symptoms.  Discussed potential benefits, risks, and side effects of stimulants with patient to include increased heart rate, palpitations, insomnia, increased anxiety, increased irritability, or decreased appetite.  Instructed patient to contact office if experiencing any significant tolerability issues.  There are no  diagnoses linked to this encounter.   Please see After Visit Summary for patient specific instructions.  No future appointments.  No orders of the defined types were placed in this encounter.   -------------------------------

## 2022-04-14 ENCOUNTER — Other Ambulatory Visit: Payer: Self-pay | Admitting: Adult Health

## 2022-04-14 DIAGNOSIS — F902 Attention-deficit hyperactivity disorder, combined type: Secondary | ICD-10-CM

## 2022-04-14 MED ORDER — AMPHETAMINE-DEXTROAMPHETAMINE 30 MG PO TABS: 30.0000 mg | ORAL_TABLET | Freq: Two times a day (BID) | ORAL | 0 refills | Status: DC

## 2022-04-20 ENCOUNTER — Other Ambulatory Visit: Payer: Self-pay | Admitting: Adult Health

## 2022-04-20 DIAGNOSIS — F902 Attention-deficit hyperactivity disorder, combined type: Secondary | ICD-10-CM

## 2022-04-20 MED ORDER — AMPHETAMINE-DEXTROAMPHETAMINE 20 MG PO TABS: 20.0000 mg | ORAL_TABLET | Freq: Two times a day (BID) | ORAL | 0 refills | Status: DC

## 2022-04-21 ENCOUNTER — Other Ambulatory Visit: Payer: Self-pay | Admitting: Adult Health

## 2022-04-21 DIAGNOSIS — F902 Attention-deficit hyperactivity disorder, combined type: Secondary | ICD-10-CM

## 2022-04-21 MED ORDER — AMPHETAMINE-DEXTROAMPHETAMINE 30 MG PO TABS: 30.0000 mg | ORAL_TABLET | Freq: Two times a day (BID) | ORAL | 0 refills | Status: DC

## 2022-04-22 ENCOUNTER — Ambulatory Visit (INDEPENDENT_AMBULATORY_CARE_PROVIDER_SITE_OTHER): Payer: BLUE CROSS/BLUE SHIELD | Admitting: Adult Health

## 2022-04-22 ENCOUNTER — Encounter: Payer: Self-pay | Admitting: Adult Health

## 2022-04-22 DIAGNOSIS — F331 Major depressive disorder, recurrent, moderate: Secondary | ICD-10-CM

## 2022-04-22 DIAGNOSIS — G4700 Insomnia, unspecified: Secondary | ICD-10-CM | POA: Diagnosis not present

## 2022-04-22 DIAGNOSIS — F902 Attention-deficit hyperactivity disorder, combined type: Secondary | ICD-10-CM | POA: Diagnosis not present

## 2022-04-22 DIAGNOSIS — F411 Generalized anxiety disorder: Secondary | ICD-10-CM

## 2022-04-22 DIAGNOSIS — G2581 Restless legs syndrome: Secondary | ICD-10-CM

## 2022-04-22 MED ORDER — FLUOXETINE HCL 20 MG PO CAPS: 20.0000 mg | ORAL_CAPSULE | Freq: Every day | ORAL | 0 refills | Status: DC

## 2022-04-22 MED ORDER — SERTRALINE HCL 50 MG PO TABS: 50.0000 mg | ORAL_TABLET | Freq: Every day | ORAL | 2 refills | Status: DC

## 2022-04-22 NOTE — Progress Notes (Signed)
Dan Dissinger [id number] 06/08/1981 41 y.o.  Subjective:   Patient ID:  Tiffany Francis is a 41 y.o. (DOB [birth year]) female.  Chief Complaint: No chief complaint on file.   HPI Tiffany Francis presents to the office today for follow-up of ADHD, anxiety, depression, insomnia, and RLS.   Describes mood today as "not the best". Pleasant. Tearful. Mood symptoms - reports depression, irritability and anxiety.  Reports panic attacks. Reports mood instability. Reports increased situational stressors. Reports worry and rumination - worried about getting another job - recent interview with probation/parole in [place]. Applying to other jobs. Concerned about son - mental health issues. Feels like the medications are helpful, but would like to switch from Prozac to Zoloft. Varying interest and motivation. Taking medications as prescribed. Energy levels lower. Active, does not have a regular exercise routine.  Enjoys some usual interests and activities. Married. Lives with husband and son. Spending time with family.  Appetite adequate. Weight loss - 215 pounds. Sleeps better some nights than others. Averages 6 or more hours. Focus and concentration stable. Completing tasks. Managing some aspects of household. Currently unemployed - recent lost her job at the [employer].  Denies SI or HI.  Denies AH or VH.  Seeing a therapist.  Reports chronic pain issues.  Taking Topamax for seizures.      Review of Systems:  Review of Systems  Musculoskeletal:  Negative for gait problem.  Neurological:  Negative for tremors.  Psychiatric/Behavioral:         Please refer to HPI    Medications: I have reviewed the patient's current medications.  Current Outpatient Medications  Medication Sig Dispense Refill   ALPRAZolam (XANAX) 1 MG tablet Take 1 tablet (1 mg total) by mouth 3 (three) times daily as needed for anxiety. 90 tablet 2   amphetamine-dextroamphetamine (ADDERALL XR) 30 MG  24 hr capsule Take 1 capsule (30 mg total) by mouth daily. 30 capsule 0   amphetamine-dextroamphetamine (ADDERALL) 20 MG tablet Take 1 tablet (20 mg total) by mouth 2 (two) times daily. 60 tablet 0   amphetamine-dextroamphetamine (ADDERALL) 30 MG tablet Take 1 tablet by mouth 2 (two) times daily. 60 tablet 0   desvenlafaxine (PRISTIQ) 100 MG 24 hr tablet Take 1 tablet (100 mg total) by mouth daily. 30 tablet 5   FLUoxetine (PROZAC) 40 MG capsule Take 1 capsule (40 mg total) by mouth daily. 30 capsule 5   halobetasol (ULTRAVATE) 0.05 % cream Apply topically at bedtime.     meloxicam (MOBIC) 15 MG tablet      omeprazole (PRILOSEC) 40 MG capsule      rizatriptan (MAXALT) 10 MG tablet Take by mouth.     rOPINIRole (REQUIP) 1 MG tablet Take one and 1/2 tabs at bedtime for restless leg syndrome. 45 tablet 5   tiZANidine (ZANAFLEX) 4 MG tablet TK 1 TO 1 AND 1/2 TS PO UP TO TID PRN     topiramate (TOPAMAX) 100 MG tablet Take 100 mg by mouth 2 (two) times daily.     No current facility-administered medications for this visit.    Medication Side Effects: None  Allergies: No Known Allergies  No past medical history on file.  Past Medical History, Surgical history, Social history, and Family history were reviewed and updated as appropriate.   Please see review of systems for further details on the patient's review from today.   Objective:   Physical Exam:  There were no vitals taken for this visit.  Physical Exam  Constitutional:      General: She is not in acute distress. Musculoskeletal:        General: No deformity.  Neurological:     Mental Status: She is alert and oriented to person, place, and time.     Coordination: Coordination normal.  Psychiatric:        Attention and Perception: Attention and perception normal. She does not perceive auditory or visual hallucinations.        Mood and Affect: Mood normal. Mood is not anxious or depressed. Affect is not labile, blunt, angry or  inappropriate.        Speech: Speech normal.        Behavior: Behavior normal.        Thought Content: Thought content normal. Thought content is not paranoid or delusional. Thought content does not include homicidal or suicidal ideation. Thought content does not include homicidal or suicidal plan.        Cognition and Memory: Cognition and memory normal.        Judgment: Judgment normal.     Comments: Insight intact     Lab Review:     Component Value Date/Time   NA 138 07/10/2008 1020   K 4.1 07/10/2008 1020   CL 107 07/10/2008 1020   CO2 24 07/10/2008 1020   GLUCOSE 90 07/10/2008 1020   BUN 11 07/10/2008 1020   CREATININE 0.66 07/10/2008 1020   CALCIUM 8.7 07/10/2008 1020   PROT 6.7 07/10/2008 1020   ALBUMIN 3.8 07/10/2008 1020   AST 26 07/10/2008 1020   ALT 29 07/10/2008 1020   ALKPHOS 97 07/10/2008 1020   BILITOT 0.6 07/10/2008 1020   GFRNONAA >60 07/10/2008 1020   GFRAA  07/10/2008 1020    >60        The eGFR has been calculated using the MDRD equation. This calculation has not been validated in all clinical       Component Value Date/Time   WBC 10.3 07/10/2008 1020   RBC 4.51 07/10/2008 1020   HGB 11.7 (L) 07/10/2008 1020   HCT 36.2 07/10/2008 1020   PLT 242 07/10/2008 1020   MCV 80.3 07/10/2008 1020   MCHC 32.2 07/10/2008 1020   RDW 15.6 (H) 07/10/2008 1020   LYMPHSABS 0.8 07/10/2008 1020   MONOABS 0.8 07/10/2008 1020   EOSABS 0.1 07/10/2008 1020   BASOSABS 0.1 07/10/2008 1020    No results found for: "POCLITH", "LITHIUM"   No results found for: "PHENYTOIN", "PHENOBARB", "VALPROATE", "CBMZ"   .res Assessment: Plan:    Plan:  Requip 1.2m at bedtime  Adderall 359mtwice daily Xanax 29m77mID taking mostly at night Pristiq 100m70mery morning D/C Prozac 40mg59m20mg 64my x 7 days, then d/c Add Zoloft 50mg -56m tablet daily x 7 days, then one tablet daily.  Monitor BP between visits   RTC 4 weeks  Patient advised to contact office with any  questions, adverse effects, or acute worsening in signs and symptoms.  Discussed potential benefits, risks, and side effects of stimulants with patient to include increased heart rate, palpitations, insomnia, increased anxiety, increased irritability, or decreased appetite.  Instructed patient to contact office if experiencing any significant tolerability issues.  There are no diagnoses linked to this encounter.   Please see After Visit Summary for patient specific instructions.  Future Appointments  Date Time Provider [email or web address]  5:00 PM Yulian Gosney Berdie Ogren-CP None    No orders of the defined types were  placed in this encounter.   -------------------------------

## 2022-04-28 ENCOUNTER — Telehealth: Payer: Self-pay | Admitting: Adult Health

## 2022-04-28 NOTE — Telephone Encounter (Signed)
Pt was in the office today wanting to know if we had received a PA on her adderall 30 mg  2x daily. Please call her and let her know status at [phone]

## 2022-04-29 NOTE — Telephone Encounter (Signed)
I do not see a PA for her but I will submit one.

## 2022-04-29 NOTE — Telephone Encounter (Signed)
Prior Authorization submitted for Amphetamine-dextroamphetamine 30 mg #60 with [employer], pending response

## 2022-05-01 NOTE — Telephone Encounter (Signed)
Prior Approval received effective 04/30/2022-04/30/2025 with [facility] for AMPHETAMINE DEXTROAMPHETAMINE 30 MG

## 2022-05-11 ENCOUNTER — Encounter: Payer: Self-pay | Admitting: Adult Health

## 2022-05-11 ENCOUNTER — Ambulatory Visit (INDEPENDENT_AMBULATORY_CARE_PROVIDER_SITE_OTHER): Payer: BLUE CROSS/BLUE SHIELD | Admitting: Adult Health

## 2022-05-11 DIAGNOSIS — F411 Generalized anxiety disorder: Secondary | ICD-10-CM

## 2022-05-11 DIAGNOSIS — F902 Attention-deficit hyperactivity disorder, combined type: Secondary | ICD-10-CM

## 2022-05-11 DIAGNOSIS — G2581 Restless legs syndrome: Secondary | ICD-10-CM | POA: Diagnosis not present

## 2022-05-11 DIAGNOSIS — G4700 Insomnia, unspecified: Secondary | ICD-10-CM

## 2022-05-11 DIAGNOSIS — F331 Major depressive disorder, recurrent, moderate: Secondary | ICD-10-CM | POA: Diagnosis not present

## 2022-05-11 MED ORDER — AMPHETAMINE-DEXTROAMPHETAMINE 30 MG PO TABS: 30.0000 mg | ORAL_TABLET | Freq: Two times a day (BID) | ORAL | 0 refills | Status: DC

## 2022-05-11 NOTE — Progress Notes (Signed)
Tiffany Francis [id number] 09-Jul-1981 41 y.o.  Subjective:   Patient ID:  Tiffany Francis is a 41 y.o. (DOB 03-27-1981) female.  Chief Complaint: No chief complaint on file.   HPI Analya Louissaint presents to the office today for follow-up of ADHD, anxiety, depression, insomnia, and RLS.   Describes mood today as "ok". Pleasant. Tearful at times. Mood symptoms - reports decreased depression, irritability and anxiety - "a little better". Reports worry and rumination - finances/job. Decreased tearfulness - "not as bad". Reports a few panic attacks. Reports mood are more consistent. Feels like the addition of Zoloft 51m has been helpful. Reports ongoing  situational stressors. [facility] full time [facility] Concerned about son - family. Varying interest and motivation. Taking medications as prescribed. Energy levels vary. Active, does not have a regular exercise routine.  Enjoys some usual interests and activities. Married. Lives with husband and son. Spending time with family.  Appetite adequate. Weight loss - 215 pounds. Sleeps better some nights than others. Averages 6 or more hours. Focus and concentration stable. Completing tasks. Managing some aspects of household. Currently unemployed - looking for employment. Working at [employer] Denies SI or HI.  Denies AH or VH.  Seeing a therapist.  Review of Systems:  Review of Systems  Musculoskeletal:  Negative for gait problem.  Neurological:  Negative for tremors.  Psychiatric/Behavioral:         Please refer to HPI    Medications: I have reviewed the patient's current medications.  Current Outpatient Medications  Medication Sig Dispense Refill   ALPRAZolam (XANAX) 1 MG tablet Take 1 tablet (1 mg total) by mouth 3 (three) times daily as needed for anxiety. 90 tablet 2   amphetamine-dextroamphetamine (ADDERALL XR) 30 MG 24 hr capsule Take 1 capsule (30 mg total) by mouth daily. 30 capsule 0    amphetamine-dextroamphetamine (ADDERALL) 20 MG tablet Take 1 tablet (20 mg total) by mouth 2 (two) times daily. 60 tablet 0   amphetamine-dextroamphetamine (ADDERALL) 30 MG tablet Take 1 tablet by mouth 2 (two) times daily. 60 tablet 0   desvenlafaxine (PRISTIQ) 100 MG 24 hr tablet Take 1 tablet (100 mg total) by mouth daily. 30 tablet 5   FLUoxetine (PROZAC) 20 MG capsule Take 1 capsule (20 mg total) by mouth daily. 7 capsule 0   FLUoxetine (PROZAC) 40 MG capsule Take 1 capsule (40 mg total) by mouth daily. 30 capsule 5   halobetasol (ULTRAVATE) 0.05 % cream Apply topically at bedtime.     meloxicam (MOBIC) 15 MG tablet      omeprazole (PRILOSEC) 40 MG capsule      rizatriptan (MAXALT) 10 MG tablet Take by mouth.     rOPINIRole (REQUIP) 1 MG tablet Take one and 1/2 tabs at bedtime for restless leg syndrome. 45 tablet 5   sertraline (ZOLOFT) 50 MG tablet Take 1 tablet (50 mg total) by mouth daily. 30 tablet 2   tiZANidine (ZANAFLEX) 4 MG tablet TK 1 TO 1 AND 1/2 TS PO UP TO TID PRN     topiramate (TOPAMAX) 100 MG tablet Take 100 mg by mouth 2 (two) times daily.     No current facility-administered medications for this visit.    Medication Side Effects: None  Allergies: No Known Allergies  No past medical history on file.  Past Medical History, Surgical history, Social history, and Family history were reviewed and updated as appropriate.   Please see review of systems for further details on the patient's review  from today.   Objective:   Physical Exam:  There were no vitals taken for this visit.  Physical Exam Constitutional:      General: She is not in acute distress. Musculoskeletal:        General: No deformity.  Neurological:     Mental Status: She is alert and oriented to person, place, and time.     Coordination: Coordination normal.  Psychiatric:        Attention and Perception: Attention and perception normal. She does not perceive auditory or visual hallucinations.         Mood and Affect: Mood normal. Mood is not anxious or depressed. Affect is not labile, blunt, angry or inappropriate.        Speech: Speech normal.        Behavior: Behavior normal.        Thought Content: Thought content normal. Thought content is not paranoid or delusional. Thought content does not include homicidal or suicidal ideation. Thought content does not include homicidal or suicidal plan.        Cognition and Memory: Cognition and memory normal.        Judgment: Judgment normal.     Comments: Insight intact     Lab Review:     Component Value Date/Time   NA 138 07/10/2008 1020   K 4.1 07/10/2008 1020   CL 107 07/10/2008 1020   CO2 24 07/10/2008 1020   GLUCOSE 90 07/10/2008 1020   BUN 11 07/10/2008 1020   CREATININE 0.66 07/10/2008 1020   CALCIUM 8.7 07/10/2008 1020   PROT 6.7 07/10/2008 1020   ALBUMIN 3.8 07/10/2008 1020   AST 26 07/10/2008 1020   ALT 29 07/10/2008 1020   ALKPHOS 97 07/10/2008 1020   BILITOT 0.6 07/10/2008 1020   GFRNONAA >60 07/10/2008 1020   GFRAA  07/10/2008 1020    >60        The eGFR has been calculated using the MDRD equation. This calculation has not been validated in all clinical       Component Value Date/Time   WBC 10.3 07/10/2008 1020   RBC 4.51 07/10/2008 1020   HGB 11.7 (L) 07/10/2008 1020   HCT 36.2 07/10/2008 1020   PLT 242 07/10/2008 1020   MCV 80.3 07/10/2008 1020   MCHC 32.2 07/10/2008 1020   RDW 15.6 (H) 07/10/2008 1020   LYMPHSABS 0.8 07/10/2008 1020   MONOABS 0.8 07/10/2008 1020   EOSABS 0.1 07/10/2008 1020   BASOSABS 0.1 07/10/2008 1020    No results found for: "POCLITH", "LITHIUM"   No results found for: "PHENYTOIN", "PHENOBARB", "VALPROATE", "CBMZ"   .res Assessment: Plan:    Plan:  Requip 1.28m at bedtime  Adderall 332mtwice daily Xanax 75m36mID taking mostly at night Pristiq 100m42mery morning Zoloft 50mg28mne tablet daily.  Monitor BP between visits   RTC 4 weeks  Patient advised to  contact office with any questions, adverse effects, or acute worsening in signs and symptoms.  Discussed potential benefits, risks, and side effects of stimulants with patient to include increased heart rate, palpitations, insomnia, increased anxiety, increased irritability, or decreased appetite.  Instructed patient to contact office if experiencing any significant tolerability issues. Diagnoses and all orders for this visit:  Major depressive disorder, recurrent episode, moderate (HCC)  Attention deficit hyperactivity disorder (ADHD), combined type  Generalized anxiety disorder  Restless legs syndrome  Insomnia, unspecified type     Please see After Visit Summary for patient specific instructions.  No future appointments.   No orders of the defined types were placed in this encounter.   -------------------------------

## 2022-06-16 ENCOUNTER — Ambulatory Visit (INDEPENDENT_AMBULATORY_CARE_PROVIDER_SITE_OTHER): Payer: BLUE CROSS/BLUE SHIELD | Admitting: Adult Health

## 2022-06-16 ENCOUNTER — Encounter: Payer: Self-pay | Admitting: Adult Health

## 2022-06-16 DIAGNOSIS — F902 Attention-deficit hyperactivity disorder, combined type: Secondary | ICD-10-CM

## 2022-06-16 DIAGNOSIS — G2581 Restless legs syndrome: Secondary | ICD-10-CM

## 2022-06-16 DIAGNOSIS — F411 Generalized anxiety disorder: Secondary | ICD-10-CM | POA: Diagnosis not present

## 2022-06-16 DIAGNOSIS — F331 Major depressive disorder, recurrent, moderate: Secondary | ICD-10-CM

## 2022-06-16 DIAGNOSIS — G4700 Insomnia, unspecified: Secondary | ICD-10-CM | POA: Diagnosis not present

## 2022-06-16 NOTE — Progress Notes (Signed)
Tiffany Francis [id number] 02-Aug-1981 41 y.o.  Subjective:   Patient ID:  Tiffany Francis is a 41 y.o. (DOB 06-13-1981) female.  Chief Complaint: No chief complaint on file.   HPI Tiffany Francis presents to the office today for follow-up of ADHD, anxiety, depression, insomnia, and RLS.   Describes mood today as "not too good". Pleasant. Tearful during interview. Mood symptoms - reports depression, irritability and anxiety - "increased with situational stressors". Has started picking at face again. Stating "I'm just not doing too good". Reports increased worry and rumination - mostly about son. Does not feel like he is doing well. Also concerned about finding a job - financial stressors. Reports a few panic attacks. Reports mood as lower. Hasn't taken medications in the past 2 days. Feels like medications are helpful, and plans to try and take more regularly. Varying interest and motivation. Taking medications as prescribed. Energy levels lower. Active, does not have a regular exercise routine.  Enjoys some usual interests and activities. Married. Lives with husband and son. Spending time with family.  Appetite adequate. Weight loss. Sleeps better some nights than others. Averages 6 or more hours. Focus and concentration stable. Completing tasks. Managing some aspects of household. Currently unemployed - looking for employment.  Denies SI or HI.  Denies AH or VH. Seeing a therapist - seen today.   Review of Systems:  Review of Systems  Musculoskeletal:  Negative for gait problem.  Neurological:  Negative for tremors.  Psychiatric/Behavioral:         Please refer to HPI    Medications: I have reviewed the patient's current medications.  Current Outpatient Medications  Medication Sig Dispense Refill   ALPRAZolam (XANAX) 1 MG tablet Take 1 tablet (1 mg total) by mouth 3 (three) times daily as needed for anxiety. 90 tablet 2   amphetamine-dextroamphetamine (ADDERALL XR) 30 MG 24 hr capsule Take  1 capsule (30 mg total) by mouth daily. 30 capsule 0   amphetamine-dextroamphetamine (ADDERALL) 20 MG tablet Take 1 tablet (20 mg total) by mouth 2 (two) times daily. 60 tablet 0   amphetamine-dextroamphetamine (ADDERALL) 30 MG tablet Take 1 tablet by mouth 2 (two) times daily. 60 tablet 0   desvenlafaxine (PRISTIQ) 100 MG 24 hr tablet Take 1 tablet (100 mg total) by mouth daily. 30 tablet 5   halobetasol (ULTRAVATE) 0.05 % cream Apply topically at bedtime.     meloxicam (MOBIC) 15 MG tablet      omeprazole (PRILOSEC) 40 MG capsule      rizatriptan (MAXALT) 10 MG tablet Take by mouth.     rOPINIRole (REQUIP) 1 MG tablet Take one and 1/2 tabs at bedtime for restless leg syndrome. 45 tablet 5   sertraline (ZOLOFT) 50 MG tablet Take 1 tablet (50 mg total) by mouth daily. 30 tablet 2   tiZANidine (ZANAFLEX) 4 MG tablet TK 1 TO 1 AND 1/2 TS PO UP TO TID PRN     topiramate (TOPAMAX) 100 MG tablet Take 100 mg by mouth 2 (two) times daily.     No current facility-administered medications for this visit.    Medication Side Effects: None  Allergies: No Known Allergies  No past medical history on file.  Past Medical History, Surgical history, Social history, and Family history were reviewed and updated as appropriate.   Please see review of systems for further details on the patient's review from today.   Objective:   Physical Exam:  There were no vitals taken for this visit.  Physical Exam  Constitutional:      General: She is not in acute distress. Musculoskeletal:        General: No deformity.  Neurological:     Mental Status: She is alert and oriented to person, place, and time.     Coordination: Coordination normal.  Psychiatric:        Attention and Perception: Attention and perception normal. She does not perceive auditory or visual hallucinations.        Mood and Affect: Mood normal. Mood is not anxious or depressed. Affect is not labile, blunt, angry or inappropriate.         Speech: Speech normal.        Behavior: Behavior normal.        Thought Content: Thought content normal. Thought content is not paranoid or delusional. Thought content does not include homicidal or suicidal ideation. Thought content does not include homicidal or suicidal plan.        Cognition and Memory: Cognition and memory normal.        Judgment: Judgment normal.     Comments: Insight intact     Lab Review:     Component Value Date/Time   NA 138 07/10/2008 1020   K 4.1 07/10/2008 1020   CL 107 07/10/2008 1020   CO2 24 07/10/2008 1020   GLUCOSE 90 07/10/2008 1020   BUN 11 07/10/2008 1020   CREATININE 0.66 07/10/2008 1020   CALCIUM 8.7 07/10/2008 1020   PROT 6.7 07/10/2008 1020   ALBUMIN 3.8 07/10/2008 1020   AST 26 07/10/2008 1020   ALT 29 07/10/2008 1020   ALKPHOS 97 07/10/2008 1020   BILITOT 0.6 07/10/2008 1020   GFRNONAA >60 07/10/2008 1020   GFRAA  07/10/2008 1020    >60        The eGFR has been calculated using the MDRD equation. This calculation has not been validated in all clinical       Component Value Date/Time   WBC 10.3 07/10/2008 1020   RBC 4.51 07/10/2008 1020   HGB 11.7 (L) 07/10/2008 1020   HCT 36.2 07/10/2008 1020   PLT 242 07/10/2008 1020   MCV 80.3 07/10/2008 1020   MCHC 32.2 07/10/2008 1020   RDW 15.6 (H) 07/10/2008 1020   LYMPHSABS 0.8 07/10/2008 1020   MONOABS 0.8 07/10/2008 1020   EOSABS 0.1 07/10/2008 1020   BASOSABS 0.1 07/10/2008 1020    No results found for: "POCLITH", "LITHIUM"   No results found for: "PHENYTOIN", "PHENOBARB", "VALPROATE", "CBMZ"   .res Assessment: Plan:    Plan:  Requip 1.67m at bedtime  Adderall 366mtwice daily Pristiq 10045mvery morning Zoloft 16m52mone tablet daily.  Xanax 1mg 92m taking BID - discussed taper off of medication.  Monitor BP between visits   RTC 4 weeks  Patient advised to contact office with any questions, adverse effects, or acute worsening in signs and symptoms.  Discussed  potential benefits, risks, and side effects of stimulants with patient to include increased heart rate, palpitations, insomnia, increased anxiety, increased irritability, or decreased appetite.  Instructed patient to contact office if experiencing any significant tolerability issues.  Diagnoses and all orders for this visit:  Major depressive disorder, recurrent episode, moderate (HCC)  Attention deficit hyperactivity disorder (ADHD), combined type  Generalized anxiety disorder  Insomnia, unspecified type  Restless legs syndrome     Please see After Visit Summary for patient specific instructions.  Future Appointments  Date Time Provider [place] Roesiger11/2023  5:20 PM Antonio Creswell,  Berdie Ogren, NP CP-CP None    No orders of the defined types were placed in this encounter.   -------------------------------

## 2022-07-13 ENCOUNTER — Ambulatory Visit (INDEPENDENT_AMBULATORY_CARE_PROVIDER_SITE_OTHER): Payer: BLUE CROSS/BLUE SHIELD | Admitting: Adult Health

## 2022-07-13 ENCOUNTER — Encounter: Payer: Self-pay | Admitting: Adult Health

## 2022-07-13 DIAGNOSIS — F902 Attention-deficit hyperactivity disorder, combined type: Secondary | ICD-10-CM

## 2022-07-13 DIAGNOSIS — F331 Major depressive disorder, recurrent, moderate: Secondary | ICD-10-CM | POA: Diagnosis not present

## 2022-07-13 DIAGNOSIS — F909 Attention-deficit hyperactivity disorder, unspecified type: Secondary | ICD-10-CM

## 2022-07-13 DIAGNOSIS — F411 Generalized anxiety disorder: Secondary | ICD-10-CM | POA: Diagnosis not present

## 2022-07-13 DIAGNOSIS — G2581 Restless legs syndrome: Secondary | ICD-10-CM

## 2022-07-13 DIAGNOSIS — G4700 Insomnia, unspecified: Secondary | ICD-10-CM

## 2022-07-13 MED ORDER — AMPHETAMINE-DEXTROAMPHETAMINE 30 MG PO TABS: 30.0000 mg | ORAL_TABLET | Freq: Two times a day (BID) | ORAL | 0 refills | Status: DC

## 2022-07-13 MED ORDER — SERTRALINE HCL 50 MG PO TABS: 50.0000 mg | ORAL_TABLET | Freq: Every day | ORAL | 2 refills | Status: DC

## 2022-07-13 NOTE — Progress Notes (Signed)
Tiffany Francis [id number] [birth year] 41 y.o.  Virtual Visit via Telephone Note  I connected with pt on 07/13/22 at  5:20 PM EST by telephone and verified that I am speaking with the correct person using two identifiers.   I discussed the limitations, risks, security and privacy concerns of performing an evaluation and management service by telephone and the availability of in person appointments. I also discussed with the patient that there may be a patient responsible charge related to this service. The patient expressed understanding and agreed to proceed.   I discussed the assessment and treatment plan with the patient. The patient was provided an opportunity to ask questions and all were answered. The patient agreed with the plan and demonstrated an understanding of the instructions.   The patient was advised to call back or seek an in-person evaluation if the symptoms worsen or if the condition fails to improve as anticipated.  I provided 25 minutes of non-face-to-face time during this encounter.  The patient was located at home.  The provider was located at [place].   Aloha Gell, NP   Subjective:   Patient ID:  Tiffany Francis is a 41 y.o. (DOB 29-Jun-1981) female.  Chief Complaint: No chief complaint on file.   HPI Dennis Killilea presents for follow-up of ADHD, anxiety, depression, insomnia, and RLS.   Describes mood today as "about the same". Pleasant. Tearful - "crying a lot". Mood symptoms - reports depression, irritability and anxiety. Increased worry, rumination, and over thinking. Reports obsessive acts - picking at face. Mood is lower. Stating "I'm having a hard time". Reports increased situational stressors - financial - hoping to get a job soon. Thinking about her grandmother - passed away last 02-Aug-2023. Feels like medications are helpful. Varying interest and motivation. Taking medications as prescribed. Energy levels lower. Active, does not have a regular  exercise routine.  Enjoys some usual interests and activities. Married. Lives with husband and son. Spending time with family.  Appetite adequate. Weight loss - 207.2 . Sleeps better some nights than others. Averages 5 hours - broken sleep. Awaiting CPAP machine - diagnosed with OSA. Focus and concentration stable. Completing tasks. Managing some aspects of household. Currently unemployed - looking for employment.  Denies SI or HI.  Denies AH or VH. Working with therapist.   Review of Systems:  Review of Systems  Musculoskeletal:  Negative for gait problem.  Neurological:  Negative for tremors.  Psychiatric/Behavioral:         Please refer to HPI    Medications: I have reviewed the patient's current medications.  Current Outpatient Medications  Medication Sig Dispense Refill   amphetamine-dextroamphetamine (ADDERALL XR) 30 MG 24 hr capsule Take 1 capsule (30 mg total) by mouth daily. 30 capsule 0   amphetamine-dextroamphetamine (ADDERALL) 20 MG tablet Take 1 tablet (20 mg total) by mouth 2 (two) times daily. 60 tablet 0   amphetamine-dextroamphetamine (ADDERALL) 30 MG tablet Take 1 tablet by mouth 2 (two) times daily. 60 tablet 0   desvenlafaxine (PRISTIQ) 100 MG 24 hr tablet Take 1 tablet (100 mg total) by mouth daily. 30 tablet 5   halobetasol (ULTRAVATE) 0.05 % cream Apply topically at bedtime.     meloxicam (MOBIC) 15 MG tablet      omeprazole (PRILOSEC) 40 MG capsule      rizatriptan (MAXALT) 10 MG tablet Take by mouth.     rOPINIRole (REQUIP) 1 MG tablet Take one and 1/2 tabs at bedtime for restless leg syndrome. 45 tablet  5   sertraline (ZOLOFT) 50 MG tablet Take 1 tablet (50 mg total) by mouth daily. 30 tablet 2   tiZANidine (ZANAFLEX) 4 MG tablet TK 1 TO 1 AND 1/2 TS PO UP TO TID PRN     topiramate (TOPAMAX) 100 MG tablet Take 100 mg by mouth 2 (two) times daily.     No current facility-administered medications for this visit.    Medication Side Effects:  None  Allergies: No Known Allergies  No past medical history on file.  No family history on file.  Social History   Socioeconomic History   Marital status: Married    Spouse name: Not on file   Number of children: Not on file   Years of education: Not on file   Highest education level: Not on file  Occupational History   Not on file  Tobacco Use   Smoking status: Never   Smokeless tobacco: Never  Substance and Sexual Activity   Alcohol use: Not on file   Drug use: Not on file   Sexual activity: Not on file  Other Topics Concern   Not on file  Social History Narrative   Not on file   Social Determinants of Health   Financial Resource Strain: Not on file  Food Insecurity: Not on file  Transportation Needs: Not on file  Physical Activity: Not on file  Stress: Not on file  Social Connections: Not on file  Intimate Partner Violence: Not on file    Past Medical History, Surgical history, Social history, and Family history were reviewed and updated as appropriate.   Please see review of systems for further details on the patient's review from today.   Objective:   Physical Exam:  There were no vitals taken for this visit.  Physical Exam Constitutional:      General: She is not in acute distress. Musculoskeletal:        General: No deformity.  Neurological:     Mental Status: She is alert and oriented to person, place, and time.     Coordination: Coordination normal.  Psychiatric:        Attention and Perception: Attention and perception normal. She does not perceive auditory or visual hallucinations.        Mood and Affect: Mood normal. Mood is not anxious or depressed. Affect is not labile, blunt, angry or inappropriate.        Speech: Speech normal.        Behavior: Behavior normal.        Thought Content: Thought content normal. Thought content is not paranoid or delusional. Thought content does not include homicidal or suicidal ideation. Thought content  does not include homicidal or suicidal plan.        Cognition and Memory: Cognition and memory normal.        Judgment: Judgment normal.     Comments: Insight intact     Lab Review:     Component Value Date/Time   NA 138 07/10/2008 1020   K 4.1 07/10/2008 1020   CL 107 07/10/2008 1020   CO2 24 07/10/2008 1020   GLUCOSE 90 07/10/2008 1020   BUN 11 07/10/2008 1020   CREATININE 0.66 07/10/2008 1020   CALCIUM 8.7 07/10/2008 1020   PROT 6.7 07/10/2008 1020   ALBUMIN 3.8 07/10/2008 1020   AST 26 07/10/2008 1020   ALT 29 07/10/2008 1020   ALKPHOS 97 07/10/2008 1020   BILITOT 0.6 07/10/2008 1020   GFRNONAA >60 07/10/2008 1020  GFRAA  07/10/2008 1020    >60        The eGFR has been calculated using the MDRD equation. This calculation has not been validated in all clinical       Component Value Date/Time   WBC 10.3 07/10/2008 1020   RBC 4.51 07/10/2008 1020   HGB 11.7 (L) 07/10/2008 1020   HCT 36.2 07/10/2008 1020   PLT 242 07/10/2008 1020   MCV 80.3 07/10/2008 1020   MCHC 32.2 07/10/2008 1020   RDW 15.6 (H) 07/10/2008 1020   LYMPHSABS 0.8 07/10/2008 1020   MONOABS 0.8 07/10/2008 1020   EOSABS 0.1 07/10/2008 1020   BASOSABS 0.1 07/10/2008 1020    No results found for: "POCLITH", "LITHIUM"   No results found for: "PHENYTOIN", "PHENOBARB", "VALPROATE", "CBMZ"   .res Assessment: Plan:    Plan:  Requip 1.52m at bedtime  Adderall 373mtwice daily Pristiq 10065mvery morning Zoloft 19m66mone tablet daily.  Monitor BP between visits   RTC 4 weeks  Patient advised to contact office with any questions, adverse effects, or acute worsening in signs and symptoms.  Discussed potential benefits, risks, and side effects of stimulants with patient to include increased heart rate, palpitations, insomnia, increased anxiety, increased irritability, or decreased appetite.  Instructed patient to contact office if experiencing any significant tolerability issues.  Diagnoses  and all orders for this visit:  Major depressive disorder, recurrent episode, moderate (HCC) -     sertraline (ZOLOFT) 50 MG tablet; Take 1 tablet (50 mg total) by mouth daily.  Attention deficit hyperactivity disorder (ADHD), combined type -     sertraline (ZOLOFT) 50 MG tablet; Take 1 tablet (50 mg total) by mouth daily. -     amphetamine-dextroamphetamine (ADDERALL) 30 MG tablet; Take 1 tablet by mouth 2 (two) times daily.  Generalized anxiety disorder  Insomnia, unspecified type  Restless legs syndrome  Attention deficit hyperactivity disorder (ADHD), unspecified ADHD type    Please see After Visit Summary for patient specific instructions.  No future appointments.  No orders of the defined types were placed in this encounter.     -------------------------------

## 2022-07-21 ENCOUNTER — Telehealth: Payer: Self-pay | Admitting: Adult Health

## 2022-07-21 ENCOUNTER — Other Ambulatory Visit: Payer: Self-pay

## 2022-07-21 MED ORDER — SERTRALINE HCL 100 MG PO TABS: 100.0000 mg | ORAL_TABLET | Freq: Every day | ORAL | 0 refills | Status: DC

## 2022-07-21 NOTE — Telephone Encounter (Signed)
I called to make pt's follow up appt.  She said she wanted to talk to Tiffany Francis about increasing her Sertraline.    Next appt 1/16

## 2022-07-21 NOTE — Telephone Encounter (Signed)
Patient would like to increase her sertraline, currently taking 50 mg. She had an appt about a week ago and didn't think she needed an increase at that time, but after talking with her therapist Pauline Aus at [facility]) she feels like with all she has going on she would benefit from an increase. As noted in last note she had lost her job and said her anxiety and depression are really bad. She feels like she has received some benefit from the sertraline and that an increase dose would help her. I'll send in Rx based on your response.   WG - [employer] in [place].

## 2022-07-21 NOTE — Telephone Encounter (Signed)
Rx sent and patient notified. Told her she could double up on 50 mg tablets if she had any left before filling the 100 mg. She is aware that she will still be taking one tablet, just a higher dose.

## 2022-07-21 NOTE — Telephone Encounter (Signed)
Ok to increase dose to 100mg  daily.

## 2022-08-18 ENCOUNTER — Ambulatory Visit: Payer: BC Managed Care – PPO | Admitting: Adult Health

## 2022-08-18 ENCOUNTER — Encounter: Payer: Self-pay | Admitting: Adult Health

## 2022-08-18 DIAGNOSIS — F331 Major depressive disorder, recurrent, moderate: Secondary | ICD-10-CM | POA: Diagnosis not present

## 2022-08-18 DIAGNOSIS — F411 Generalized anxiety disorder: Secondary | ICD-10-CM | POA: Diagnosis not present

## 2022-08-18 DIAGNOSIS — G2581 Restless legs syndrome: Secondary | ICD-10-CM

## 2022-08-18 DIAGNOSIS — F902 Attention-deficit hyperactivity disorder, combined type: Secondary | ICD-10-CM

## 2022-08-18 DIAGNOSIS — G4700 Insomnia, unspecified: Secondary | ICD-10-CM

## 2022-08-18 MED ORDER — AMPHETAMINE-DEXTROAMPHETAMINE 30 MG PO TABS: 30.0000 mg | ORAL_TABLET | Freq: Two times a day (BID) | ORAL | 0 refills | Status: DC

## 2022-08-18 MED ORDER — SERTRALINE HCL 100 MG PO TABS: ORAL_TABLET | ORAL | 5 refills | Status: DC

## 2022-08-18 NOTE — Progress Notes (Signed)
Tiffany Francis [id number] 03-Apr-1981 42 y.o.  Subjective:   Patient ID:  Tiffany Francis is a 42 y.o. (DOB [birth year]) female.  Chief Complaint: No chief complaint on file.   HPI Tiffany Francis presents to the office today for follow-up of ADHD, anxiety, depression, insomnia, and RLS.   Describes mood today as "about the same". Pleasant. Tearful at times. Mood symptoms - reports depression, irritability and anxiety. Reports worry, rumination, and over thinking. Reports obsessive acts - picking at face. Reports ongoing situational stressors - health - son - financial. Has applied for several jobs. Mood is lower. Feels like medications are helpful. Varying interest and motivation. Taking medications as prescribed. Energy levels lower - "maybe a little better". Active, does not have a regular exercise routine.  Enjoys some usual interests and activities. Married. Lives with husband and son. Spending time with family.  Appetite adequate. Weight loss - 200 pounds. Sleeps better some nights than others - up and down during the night. Averages 5 hours. Using CPAP machine. Focus and concentration stable. Completing tasks. Managing some aspects of household. Currently unemployed - looking for employment opportunities.  Denies SI or HI.  Denies AH or VH.   Working with therapist.      Review of Systems:  Review of Systems  Musculoskeletal:  Negative for gait problem.  Neurological:  Negative for tremors.  Psychiatric/Behavioral:         Please refer to HPI    Medications: I have reviewed the patient's current medications.  Current Outpatient Medications  Medication Sig Dispense Refill  . amphetamine-dextroamphetamine (ADDERALL XR) 30 MG 24 hr capsule Take 1 capsule (30 mg total) by mouth daily. 30 capsule 0  . amphetamine-dextroamphetamine (ADDERALL) 20 MG tablet Take 1 tablet (20 mg total) by mouth 2 (two) times daily. 60 tablet 0  . amphetamine-dextroamphetamine (ADDERALL) 30 MG tablet Take 1  tablet by mouth 2 (two) times daily. 60 tablet 0  . desvenlafaxine (PRISTIQ) 100 MG 24 hr tablet Take 1 tablet (100 mg total) by mouth daily. 30 tablet 5  . halobetasol (ULTRAVATE) 0.05 % cream Apply topically at bedtime.    . meloxicam (MOBIC) 15 MG tablet     . omeprazole (PRILOSEC) 40 MG capsule     . rizatriptan (MAXALT) 10 MG tablet Take by mouth.    Marland Kitchen rOPINIRole (REQUIP) 1 MG tablet Take one and 1/2 tabs at bedtime for restless leg syndrome. 45 tablet 5  . sertraline (ZOLOFT) 100 MG tablet Take 1 tablet (100 mg total) by mouth daily. 30 tablet 0  . tiZANidine (ZANAFLEX) 4 MG tablet TK 1 TO 1 AND 1/2 TS PO UP TO TID PRN    . topiramate (TOPAMAX) 100 MG tablet Take 100 mg by mouth 2 (two) times daily.     No current facility-administered medications for this visit.    Medication Side Effects: None  Allergies: No Known Allergies  No past medical history on file.  Past Medical History, Surgical history, Social history, and Family history were reviewed and updated as appropriate.   Please see review of systems for further details on the patient's review from today.   Objective:   Physical Exam:  There were no vitals taken for this visit.  Physical Exam Constitutional:      General: She is not in acute distress. Musculoskeletal:        General: No deformity.  Neurological:     Mental Status: She is alert and oriented to person, place, and time.  Coordination: Coordination normal.  Psychiatric:        Attention and Perception: Attention and perception normal. She does not perceive auditory or visual hallucinations.        Mood and Affect: Mood normal. Mood is not anxious or depressed. Affect is not labile, blunt, angry or inappropriate.        Speech: Speech normal.        Behavior: Behavior normal.        Thought Content: Thought content normal. Thought content is not paranoid or delusional. Thought content does not include homicidal or suicidal ideation. Thought content  does not include homicidal or suicidal plan.        Cognition and Memory: Cognition and memory normal.        Judgment: Judgment normal.     Comments: Insight intact    Lab Review:     Component Value Date/Time   NA 138 07/10/2008 1020   K 4.1 07/10/2008 1020   CL 107 07/10/2008 1020   CO2 24 07/10/2008 1020   GLUCOSE 90 07/10/2008 1020   BUN 11 07/10/2008 1020   CREATININE 0.66 07/10/2008 1020   CALCIUM 8.7 07/10/2008 1020   PROT 6.7 07/10/2008 1020   ALBUMIN 3.8 07/10/2008 1020   AST 26 07/10/2008 1020   ALT 29 07/10/2008 1020   ALKPHOS 97 07/10/2008 1020   BILITOT 0.6 07/10/2008 1020   GFRNONAA >60 07/10/2008 1020   GFRAA  07/10/2008 1020    >60        The eGFR has been calculated using the MDRD equation. This calculation has not been validated in all clinical       Component Value Date/Time   WBC 10.3 07/10/2008 1020   RBC 4.51 07/10/2008 1020   HGB 11.7 (L) 07/10/2008 1020   HCT 36.2 07/10/2008 1020   PLT 242 07/10/2008 1020   MCV 80.3 07/10/2008 1020   MCHC 32.2 07/10/2008 1020   RDW 15.6 (H) 07/10/2008 1020   LYMPHSABS 0.8 07/10/2008 1020   MONOABS 0.8 07/10/2008 1020   EOSABS 0.1 07/10/2008 1020   BASOSABS 0.1 07/10/2008 1020    No results found for: "POCLITH", "LITHIUM"   No results found for: "PHENYTOIN", "PHENOBARB", "VALPROATE", "CBMZ"   .res Assessment: Plan:    Plan:  Requip 1.5mg  at bedtime  Adderall 30mg  twice daily Pristiq 100mg  every morning  Increase Zoloft 100mg  to 150mg  - one tablet daily.  Monitor BP between visits   RTC 4 weeks  Patient advised to contact office with any questions, adverse effects, or acute worsening in signs and symptoms.  Discussed potential benefits, risks, and side effects of stimulants with patient to include increased heart rate, palpitations, insomnia, increased anxiety, increased irritability, or decreased appetite.  Instructed patient to contact office if experiencing any significant tolerability  issues.  Diagnoses and all orders for this visit:  Major depressive disorder, recurrent episode, moderate (HCC)  Attention deficit hyperactivity disorder (ADHD), combined type  Generalized anxiety disorder  Restless legs syndrome  Insomnia, unspecified type     Please see After Visit Summary for patient specific instructions.  No future appointments.   No orders of the defined types were placed in this encounter.   -------------------------------

## 2022-09-03 ENCOUNTER — Other Ambulatory Visit: Payer: Self-pay

## 2022-09-03 ENCOUNTER — Telehealth: Payer: Self-pay | Admitting: Adult Health

## 2022-09-03 DIAGNOSIS — F902 Attention-deficit hyperactivity disorder, combined type: Secondary | ICD-10-CM

## 2022-09-03 MED ORDER — AMPHETAMINE-DEXTROAMPHET ER 30 MG PO CP24: 30.0000 mg | ORAL_CAPSULE | Freq: Every day | ORAL | 0 refills | Status: DC

## 2022-09-03 NOTE — Telephone Encounter (Signed)
Pt requesting generic adderall 30 mg Rx canc and send to [facility] [name], [place]  In stock there. [place] location out. Provider out today.

## 2022-09-03 NOTE — Telephone Encounter (Signed)
Pended to Tiffany Francis

## 2022-09-04 ENCOUNTER — Telehealth: Payer: Self-pay | Admitting: Behavioral Health

## 2022-09-04 ENCOUNTER — Other Ambulatory Visit: Payer: Self-pay

## 2022-09-04 DIAGNOSIS — F902 Attention-deficit hyperactivity disorder, combined type: Secondary | ICD-10-CM

## 2022-09-04 MED ORDER — AMPHETAMINE-DEXTROAMPHET ER 30 MG PO CP24: 30.0000 mg | ORAL_CAPSULE | Freq: Every day | ORAL | 0 refills | Status: DC

## 2022-09-04 NOTE — Telephone Encounter (Signed)
Addendum to previous message sent. Tiffany Francis called and asked if her medication be sent to [facility] on [address], [place], [place]. [phone] Pharmacy # Its in [place].

## 2022-09-04 NOTE — Telephone Encounter (Signed)
Pended.

## 2022-09-21 ENCOUNTER — Encounter: Payer: Self-pay | Admitting: Adult Health

## 2022-09-21 ENCOUNTER — Ambulatory Visit (INDEPENDENT_AMBULATORY_CARE_PROVIDER_SITE_OTHER): Payer: BC Managed Care – PPO | Admitting: Adult Health

## 2022-09-21 DIAGNOSIS — F902 Attention-deficit hyperactivity disorder, combined type: Secondary | ICD-10-CM

## 2022-09-21 DIAGNOSIS — F411 Generalized anxiety disorder: Secondary | ICD-10-CM

## 2022-09-21 DIAGNOSIS — G2581 Restless legs syndrome: Secondary | ICD-10-CM

## 2022-09-21 DIAGNOSIS — F331 Major depressive disorder, recurrent, moderate: Secondary | ICD-10-CM

## 2022-09-21 MED ORDER — DESVENLAFAXINE SUCCINATE ER 100 MG PO TB24: 100.0000 mg | ORAL_TABLET | Freq: Every day | ORAL | 5 refills | Status: DC

## 2022-09-21 MED ORDER — AMPHETAMINE-DEXTROAMPHETAMINE 30 MG PO TABS: 30.0000 mg | ORAL_TABLET | Freq: Two times a day (BID) | ORAL | 0 refills | Status: DC

## 2022-09-21 MED ORDER — ROPINIROLE HCL 1 MG PO TABS: ORAL_TABLET | ORAL | 5 refills | Status: DC

## 2022-09-21 MED ORDER — SERTRALINE HCL 100 MG PO TABS: ORAL_TABLET | ORAL | 5 refills | Status: DC

## 2022-09-21 NOTE — Progress Notes (Signed)
Tiffany Francis [id number] 09/12/1980 42 y.o.  Subjective:   Patient ID:  Tiffany Francis is a 42 y.o. (DOB November 21, 1980) female.  Chief Complaint: No chief complaint on file.   HPI Tiffany Francis presents to the office today for follow-up of ADHD, anxiety, depression, insomnia, and RLS.   Describes mood today as "not to good". Pleasant. Tearful at times. Mood symptoms - reports depression, irritability and anxiety. Reports worry, rumination, and over thinking. Reports obsessive acts - picking. Mood is lower. Feels like medications are helpful. Reports situational stressors - health - son - financial. Interviewing for jobs. Varying interest and motivation. Taking medications as prescribed. Energy levels lower. Active, does not have a regular exercise routine.  Enjoys some usual interests and activities. Married. Lives with husband and son. Spending time with family.  Appetite adequate. Weight gain. Sleeps better some nights than others - waking up 2 to 3 times to go to the bathroom. Averages 5 to 7 hours. Trying to use CPAP machine - awaiting a new mask. Focus and concentration stable. Completing tasks. Managing some aspects of household. Currently unemployed - looking for employment opportunities.  Denies SI or HI.  Denies AH or VH. Denies self harm.   Working with therapist.   Review of Systems:  Review of Systems  Musculoskeletal:  Negative for gait problem.  Neurological:  Negative for tremors.  Psychiatric/Behavioral:         Please refer to HPI    Medications: I have reviewed the patient's current medications.   Current Outpatient Medications  Medication Sig Dispense Refill   amphetamine-dextroamphetamine (ADDERALL XR) 30 MG 24 hr capsule Take 1 capsule (30 mg total) by mouth daily. 30 capsule 0   amphetamine-dextroamphetamine (ADDERALL) 20 MG tablet Take 1 tablet (20 mg total) by mouth 2 (two) times daily. 60 tablet 0   amphetamine-dextroamphetamine (ADDERALL) 30 MG tablet Take 1  tablet by mouth 2 (two) times daily. 60 tablet 0   desvenlafaxine (PRISTIQ) 100 MG 24 hr tablet Take 1 tablet (100 mg total) by mouth daily. 30 tablet 5   halobetasol (ULTRAVATE) 0.05 % cream Apply topically at bedtime.     meloxicam (MOBIC) 15 MG tablet      omeprazole (PRILOSEC) 40 MG capsule      rizatriptan (MAXALT) 10 MG tablet Take by mouth.     rOPINIRole (REQUIP) 1 MG tablet Take one and 1/2 tabs at bedtime for restless leg syndrome. 45 tablet 5   sertraline (ZOLOFT) 100 MG tablet Take one and 1/2 tablets daily. 30 tablet 5   tiZANidine (ZANAFLEX) 4 MG tablet TK 1 TO 1 AND 1/2 TS PO UP TO TID PRN     topiramate (TOPAMAX) 100 MG tablet Take 100 mg by mouth 2 (two) times daily.     No current facility-administered medications for this visit.    Medication Side Effects: None  Allergies: No Known Allergies  No past medical history on file.  Past Medical History, Surgical history, Social history, and Family history were reviewed and updated as appropriate.   Please see review of systems for further details on the patient's review from today.   Objective:   Physical Exam:  There were no vitals taken for this visit.  Physical Exam Constitutional:      General: She is not in acute distress. Musculoskeletal:        General: No deformity.  Neurological:     Mental Status: She is alert and oriented to person, place, and time.  Coordination: Coordination normal.  Psychiatric:        Attention and Perception: Attention and perception normal. She does not perceive auditory or visual hallucinations.        Mood and Affect: Mood normal. Mood is not anxious or depressed. Affect is not labile, blunt, angry or inappropriate.        Speech: Speech normal.        Behavior: Behavior normal.        Thought Content: Thought content normal. Thought content is not paranoid or delusional. Thought content does not include homicidal or suicidal ideation. Thought content does not include  homicidal or suicidal plan.        Cognition and Memory: Cognition and memory normal.        Judgment: Judgment normal.     Comments: Insight intact     Lab Review:     Component Value Date/Time   NA 138 07/10/2008 1020   K 4.1 07/10/2008 1020   CL 107 07/10/2008 1020   CO2 24 07/10/2008 1020   GLUCOSE 90 07/10/2008 1020   BUN 11 07/10/2008 1020   CREATININE 0.66 07/10/2008 1020   CALCIUM 8.7 07/10/2008 1020   PROT 6.7 07/10/2008 1020   ALBUMIN 3.8 07/10/2008 1020   AST 26 07/10/2008 1020   ALT 29 07/10/2008 1020   ALKPHOS 97 07/10/2008 1020   BILITOT 0.6 07/10/2008 1020   GFRNONAA >60 07/10/2008 1020   GFRAA  07/10/2008 1020    >60        The eGFR has been calculated using the MDRD equation. This calculation has not been validated in all clinical       Component Value Date/Time   WBC 10.3 07/10/2008 1020   RBC 4.51 07/10/2008 1020   HGB 11.7 (L) 07/10/2008 1020   HCT 36.2 07/10/2008 1020   PLT 242 07/10/2008 1020   MCV 80.3 07/10/2008 1020   MCHC 32.2 07/10/2008 1020   RDW 15.6 (H) 07/10/2008 1020   LYMPHSABS 0.8 07/10/2008 1020   MONOABS 0.8 07/10/2008 1020   EOSABS 0.1 07/10/2008 1020   BASOSABS 0.1 07/10/2008 1020    No results found for: "POCLITH", "LITHIUM"   No results found for: "PHENYTOIN", "PHENOBARB", "VALPROATE", "CBMZ"   .res Assessment: Plan:    Plan:  Requip 1.43m at bedtime  Adderall 334mtwice daily Pristiq 10033mvery morning Zoloft 150m71mone tablet daily.  Monitor BP between visits   RTC 4 weeks  Patient advised to contact office with any questions, adverse effects, or acute worsening in signs and symptoms.  Discussed potential benefits, risks, and side effects of stimulants with patient to include increased heart rate, palpitations, insomnia, increased anxiety, increased irritability, or decreased appetite.  Instructed patient to contact office if experiencing any significant tolerability issues.  There are no diagnoses  linked to this encounter.   Please see After Visit Summary for patient specific instructions.  Future Appointments  Date Time Provider [place]19/2024  5:00 PM Iyesha Such, RegiBerdie Ogren CP-CP None    No orders of the defined types were placed in this encounter.   -------------------------------

## 2022-10-12 ENCOUNTER — Telehealth: Payer: Self-pay | Admitting: Adult Health

## 2022-10-12 NOTE — Telephone Encounter (Signed)
Tiffany Francis called at 4:05 to report that she only has 3-4 pills left of her Adderall.  She can't find it.  I told her we are changes the mediations or dosage because of issues with all the doses and even other similar medications.  I told her we could offer her a written prescription to have when she finds a pharmacy that has it.  She is just unsure what to do and what you would prefer and recommend she so.  She for a call back.

## 2022-10-13 NOTE — Telephone Encounter (Signed)
I called Elta and told her we could send a script to her to have on hand if she can find it.  She said she will obviously be stopping the medication for now.  She did request for [place] send her a script to have on hand just in case. Please print one for [place] to mail.

## 2022-10-13 NOTE — Telephone Encounter (Signed)
Did she try [facility] in [place]?

## 2022-10-21 ENCOUNTER — Ambulatory Visit: Payer: BC Managed Care – PPO | Admitting: Adult Health

## 2022-11-09 ENCOUNTER — Encounter: Payer: Self-pay | Admitting: Adult Health

## 2022-11-09 ENCOUNTER — Ambulatory Visit (INDEPENDENT_AMBULATORY_CARE_PROVIDER_SITE_OTHER): Payer: BC Managed Care – PPO | Admitting: Adult Health

## 2022-11-09 DIAGNOSIS — G2581 Restless legs syndrome: Secondary | ICD-10-CM

## 2022-11-09 DIAGNOSIS — F331 Major depressive disorder, recurrent, moderate: Secondary | ICD-10-CM | POA: Diagnosis not present

## 2022-11-09 DIAGNOSIS — F902 Attention-deficit hyperactivity disorder, combined type: Secondary | ICD-10-CM | POA: Diagnosis not present

## 2022-11-09 DIAGNOSIS — G4700 Insomnia, unspecified: Secondary | ICD-10-CM

## 2022-11-09 DIAGNOSIS — F411 Generalized anxiety disorder: Secondary | ICD-10-CM | POA: Diagnosis not present

## 2022-11-09 MED ORDER — AMPHETAMINE-DEXTROAMPHET ER 30 MG PO CP24: 30.0000 mg | ORAL_CAPSULE | Freq: Every day | ORAL | 0 refills | Status: DC

## 2022-11-09 NOTE — Progress Notes (Signed)
Tiffany Francis [id number] 10-24-1980 42 y.o.  Subjective:   Patient ID:  Tiffany Francis is a 41 y.o. (DOB Nov 10, 1980) female.  Chief Complaint: No chief complaint on file.   HPI Berenda Garry presents to the office today for follow-up of ADHD, anxiety, depression, insomnia, and RLS.   Describes mood today as "not to good". Pleasant. Tearful at times. Mood symptoms - reports depression, irritability and anxiety. Reports worry, rumination, and over thinking. Reports obsessive acts - decreased picking - biting nails. Mood is lower. Reports increased situational stressors. Reporting issues in marriage. Difficulties finding a job - hoping to hear from [facility] [employer]. Varying interest and motivation. Taking medications as prescribed. Energy levels lower. Active, does not have a regular exercise routine.  Enjoys some usual interests and activities. Married. Lives with husband and son. Spending time with family.  Appetite adequate. Weight gain. Sleeps better some nights than others. Averages 5 to 7 hours. Trying to use CPAP machine - awaiting a new mask. Focus and concentration stable. Completing tasks. Managing some aspects of household. Currently unemployed - looking for employment opportunities.  Denies SI or HI.  Denies AH or VH. Denies self harm.   Working with therapist.  Review of Systems:  Review of Systems  Musculoskeletal:  Negative for gait problem.  Neurological:  Negative for tremors.  Psychiatric/Behavioral:         Please refer to HPI    Medications: I have reviewed the patient's current medications.  Current Outpatient Medications  Medication Sig Dispense Refill   amphetamine-dextroamphetamine (ADDERALL XR) 30 MG 24 hr capsule Take 1 capsule (30 mg total) by mouth daily. 30 capsule 0   amphetamine-dextroamphetamine (ADDERALL) 20 MG tablet Take 1 tablet (20 mg total) by mouth 2 (two) times daily. 60 tablet 0   amphetamine-dextroamphetamine (ADDERALL) 30 MG tablet Take 1 tablet by mouth 2  (two) times daily. 60 tablet 0   desvenlafaxine (PRISTIQ) 100 MG 24 hr tablet Take 1 tablet (100 mg total) by mouth daily. 30 tablet 5   halobetasol (ULTRAVATE) 0.05 % cream Apply topically at bedtime.     meloxicam (MOBIC) 15 MG tablet      omeprazole (PRILOSEC) 40 MG capsule      rizatriptan (MAXALT) 10 MG tablet Take by mouth.     rOPINIRole (REQUIP) 1 MG tablet Take one and 1/2 tabs at bedtime for restless leg syndrome. 45 tablet 5   sertraline (ZOLOFT) 100 MG tablet Take one and 1/2 tablets daily. 45 tablet 5   tiZANidine (ZANAFLEX) 4 MG tablet TK 1 TO 1 AND 1/2 TS PO UP TO TID PRN     topiramate (TOPAMAX) 100 MG tablet Take 100 mg by mouth 2 (two) times daily.     No current facility-administered medications for this visit.    Medication Side Effects: None  Allergies: No Known Allergies  No past medical history on file.  Past Medical History, Surgical history, Social history, and Family history were reviewed and updated as appropriate.   Please see review of systems for further details on the patient's review from today.   Objective:   Physical Exam:  There were no vitals taken for this visit.  Physical Exam Constitutional:      General: She is not in acute distress. Musculoskeletal:        General: No deformity.  Neurological:     Mental Status: She is alert and oriented to person, place, and time.     Coordination: Coordination normal.  Psychiatric:  Attention and Perception: Attention and perception normal. She does not perceive auditory or visual hallucinations.        Mood and Affect: Mood normal. Mood is not anxious or depressed. Affect is not labile, blunt, angry or inappropriate.        Speech: Speech normal.        Behavior: Behavior normal.        Thought Content: Thought content normal. Thought content is not paranoid or delusional. Thought content does not include homicidal or suicidal ideation. Thought content does not include homicidal or suicidal  plan.        Cognition and Memory: Cognition and memory normal.        Judgment: Judgment normal.     Comments: Insight intact     Lab Review:     Component Value Date/Time   NA 138 07/10/2008 1020   K 4.1 07/10/2008 1020   CL 107 07/10/2008 1020   CO2 24 07/10/2008 1020   GLUCOSE 90 07/10/2008 1020   BUN 11 07/10/2008 1020   CREATININE 0.66 07/10/2008 1020   CALCIUM 8.7 07/10/2008 1020   PROT 6.7 07/10/2008 1020   ALBUMIN 3.8 07/10/2008 1020   AST 26 07/10/2008 1020   ALT 29 07/10/2008 1020   ALKPHOS 97 07/10/2008 1020   BILITOT 0.6 07/10/2008 1020   GFRNONAA >60 07/10/2008 1020   GFRAA  07/10/2008 1020    >60        The eGFR has been calculated using the MDRD equation. This calculation has not been validated in all clinical       Component Value Date/Time   WBC 10.3 07/10/2008 1020   RBC 4.51 07/10/2008 1020   HGB 11.7 (L) 07/10/2008 1020   HCT 36.2 07/10/2008 1020   PLT 242 07/10/2008 1020   MCV 80.3 07/10/2008 1020   MCHC 32.2 07/10/2008 1020   RDW 15.6 (H) 07/10/2008 1020   LYMPHSABS 0.8 07/10/2008 1020   MONOABS 0.8 07/10/2008 1020   EOSABS 0.1 07/10/2008 1020   BASOSABS 0.1 07/10/2008 1020    No results found for: "POCLITH", "LITHIUM"   No results found for: "PHENYTOIN", "PHENOBARB", "VALPROATE", "CBMZ"   .res Assessment: Plan:    Plan:  Requip 1.5mg  at bedtime  Adderall XR 30mg  daily Pristiq 100mg  every morning Zoloft 150mg  - one tablet daily.  Monitor BP between visits   RTC 4 weeks  Patient advised to contact office with any questions, adverse effects, or acute worsening in signs and symptoms.  Discussed potential benefits, risks, and side effects of stimulants with patient to include increased heart rate, palpitations, insomnia, increased anxiety, increased irritability, or decreased appetite.  Instructed patient to contact office if experiencing any significant tolerability issues.  Diagnoses and all orders for this visit:  Major  depressive disorder, recurrent episode, moderate  Attention deficit hyperactivity disorder (ADHD), combined type -     amphetamine-dextroamphetamine (ADDERALL XR) 30 MG 24 hr capsule; Take 1 capsule (30 mg total) by mouth daily.  Restless legs syndrome  Generalized anxiety disorder  Insomnia, unspecified type     Please see After Visit Summary for patient specific instructions.  No future appointments.  No orders of the defined types were placed in this encounter.   -------------------------------

## 2022-12-08 ENCOUNTER — Ambulatory Visit: Payer: BC Managed Care – PPO | Admitting: Adult Health

## 2023-01-20 ENCOUNTER — Other Ambulatory Visit: Payer: Self-pay | Admitting: Adult Health

## 2023-01-20 DIAGNOSIS — F902 Attention-deficit hyperactivity disorder, combined type: Secondary | ICD-10-CM

## 2023-01-20 MED ORDER — AMPHETAMINE-DEXTROAMPHET ER 30 MG PO CP24: 30.0000 mg | ORAL_CAPSULE | Freq: Every day | ORAL | 0 refills | Status: DC

## 2023-01-26 ENCOUNTER — Encounter: Payer: Self-pay | Admitting: Adult Health

## 2023-01-26 ENCOUNTER — Ambulatory Visit (INDEPENDENT_AMBULATORY_CARE_PROVIDER_SITE_OTHER): Payer: BC Managed Care – PPO | Admitting: Adult Health

## 2023-01-26 DIAGNOSIS — F411 Generalized anxiety disorder: Secondary | ICD-10-CM

## 2023-01-26 DIAGNOSIS — G2581 Restless legs syndrome: Secondary | ICD-10-CM

## 2023-01-26 DIAGNOSIS — F902 Attention-deficit hyperactivity disorder, combined type: Secondary | ICD-10-CM

## 2023-01-26 DIAGNOSIS — F331 Major depressive disorder, recurrent, moderate: Secondary | ICD-10-CM | POA: Diagnosis not present

## 2023-01-26 MED ORDER — SERTRALINE HCL 100 MG PO TABS
ORAL_TABLET | ORAL | 5 refills | Status: DC
Start: 2023-01-26 — End: 2023-10-26

## 2023-01-26 MED ORDER — AMPHETAMINE-DEXTROAMPHET ER 30 MG PO CP24: 30.0000 mg | ORAL_CAPSULE | Freq: Every day | ORAL | 0 refills | Status: DC

## 2023-01-26 MED ORDER — ROPINIROLE HCL 1 MG PO TABS
ORAL_TABLET | ORAL | 5 refills | Status: DC
Start: 2023-01-26 — End: 2023-10-26

## 2023-01-26 MED ORDER — DESVENLAFAXINE SUCCINATE ER 100 MG PO TB24: 100.0000 mg | ORAL_TABLET | Freq: Every day | ORAL | 5 refills | Status: DC

## 2023-01-26 NOTE — Progress Notes (Signed)
Tiffany Francis [id number] 1980-09-15 42 y.o.  Subjective:   Patient ID:  Tiffany Francis is a 42 y.o. (DOB 03-Jul-1981) female.  Chief Complaint: No chief complaint on file.   HPI Tiffany Francis presents to the office today for follow-up of ADHD, anxiety, depression, insomnia, and RLS.   Describes mood today as "not to good". Pleasant. Tearful at times. Mood symptoms - reports deceased depression, irritability and anxiety. Reports decreased worry, rumination, and over thinking - concerned about son - seizures. Reports obsessive acts - decreased picking - biting nails. Reports decreased situational stressors. Recently started a job at the [employer]. Mood has improved. Feels like medications are helpful. Varying interest and motivation. Taking medications as prescribed. Energy levels lower. Active, does not have a regular exercise routine.  Enjoys some usual interests and activities. Married. Lives with husband and son. Spending time with family.  Appetite adequate. Weight stable - 204 pounds. Sleeps better some nights than others. Averages 5 to 7 hours. Trying to use CPAP machine - awaiting a new mask. Focus and concentration stable. Completing tasks. Managing some aspects of household. Started a new job at the [employer].  Denies SI or HI.  Denies AH or VH. Denies self harm. Denies substance use.   Review of Systems:  Review of Systems  Musculoskeletal:  Negative for gait problem.  Neurological:  Negative for tremors.  Psychiatric/Behavioral:         Please refer to HPI    Medications: I have reviewed the patient's current medications.  Current Outpatient Medications  Medication Sig Dispense Refill   amphetamine-dextroamphetamine (ADDERALL XR) 30 MG 24 hr capsule Take 1 capsule (30 mg total) by mouth daily. 30 capsule 0   amphetamine-dextroamphetamine (ADDERALL) 20 MG tablet Take 1 tablet (20 mg total) by mouth 2 (two) times daily. 60 tablet 0   amphetamine-dextroamphetamine (ADDERALL) 30 MG tablet  Take 1 tablet by mouth 2 (two) times daily. 60 tablet 0   desvenlafaxine (PRISTIQ) 100 MG 24 hr tablet Take 1 tablet (100 mg total) by mouth daily. 30 tablet 5   halobetasol (ULTRAVATE) 0.05 % cream Apply topically at bedtime.     meloxicam (MOBIC) 15 MG tablet      omeprazole (PRILOSEC) 40 MG capsule      rizatriptan (MAXALT) 10 MG tablet Take by mouth.     rOPINIRole (REQUIP) 1 MG tablet Take one and 1/2 tabs at bedtime for restless leg syndrome. 45 tablet 5   sertraline (ZOLOFT) 100 MG tablet Take one and 1/2 tablets daily. 45 tablet 5   tiZANidine (ZANAFLEX) 4 MG tablet TK 1 TO 1 AND 1/2 TS PO UP TO TID PRN     topiramate (TOPAMAX) 100 MG tablet Take 100 mg by mouth 2 (two) times daily.     No current facility-administered medications for this visit.    Medication Side Effects: None  Allergies: No Known Allergies  No past medical history on file.  Past Medical History, Surgical history, Social history, and Family history were reviewed and updated as appropriate.   Please see review of systems for further details on the patient's review from today.   Objective:   Physical Exam:  There were no vitals taken for this visit.  Physical Exam Constitutional:      General: She is not in acute distress. Musculoskeletal:        General: No deformity.  Neurological:     Mental Status: She is alert and oriented to person, place, and time.     Coordination:  Coordination normal.  Psychiatric:        Attention and Perception: Attention and perception normal. She does not perceive auditory or visual hallucinations.        Mood and Affect: Mood normal. Mood is not anxious or depressed. Affect is not labile, blunt, angry or inappropriate.        Speech: Speech normal.        Behavior: Behavior normal.        Thought Content: Thought content normal. Thought content is not paranoid or delusional. Thought content does not include homicidal or suicidal ideation. Thought content does not  include homicidal or suicidal plan.        Cognition and Memory: Cognition and memory normal.        Judgment: Judgment normal.     Comments: Insight intact     Lab Review:     Component Value Date/Time   NA 138 07/10/2008 1020   K 4.1 07/10/2008 1020   CL 107 07/10/2008 1020   CO2 24 07/10/2008 1020   GLUCOSE 90 07/10/2008 1020   BUN 11 07/10/2008 1020   CREATININE 0.66 07/10/2008 1020   CALCIUM 8.7 07/10/2008 1020   PROT 6.7 07/10/2008 1020   ALBUMIN 3.8 07/10/2008 1020   AST 26 07/10/2008 1020   ALT 29 07/10/2008 1020   ALKPHOS 97 07/10/2008 1020   BILITOT 0.6 07/10/2008 1020   GFRNONAA >60 07/10/2008 1020   GFRAA  07/10/2008 1020    >60        The eGFR has been calculated using the MDRD equation. This calculation has not been validated in all clinical       Component Value Date/Time   WBC 10.3 07/10/2008 1020   RBC 4.51 07/10/2008 1020   HGB 11.7 (L) 07/10/2008 1020   HCT 36.2 07/10/2008 1020   PLT 242 07/10/2008 1020   MCV 80.3 07/10/2008 1020   MCHC 32.2 07/10/2008 1020   RDW 15.6 (H) 07/10/2008 1020   LYMPHSABS 0.8 07/10/2008 1020   MONOABS 0.8 07/10/2008 1020   EOSABS 0.1 07/10/2008 1020   BASOSABS 0.1 07/10/2008 1020    No results found for: "POCLITH", "LITHIUM"   No results found for: "PHENYTOIN", "PHENOBARB", "VALPROATE", "CBMZ"   .res Assessment: Plan:    Plan:  Requip 1.5mg  at bedtime  Adderall XR 30mg  daily Pristiq 100mg  every morning Zoloft 150mg  - one tablet daily.  Monitor BP between visits   RTC 4 weeks  Patient advised to contact office with any questions, adverse effects, or acute worsening in signs and symptoms.  Discussed potential benefits, risks, and side effects of stimulants with patient to include increased heart rate, palpitations, insomnia, increased anxiety, increased irritability, or decreased appetite.  Instructed patient to contact office if experiencing any significant tolerability issues. There are no diagnoses  linked to this encounter.   Please see After Visit Summary for patient specific instructions.  Future Appointments  Date Time Provider Department Center  01/26/2023  5:20 PM Nyari Olsson, Thereasa Solo, NP [facility] None    No orders of the defined types were placed in this encounter.   -------------------------------

## 2023-03-26 ENCOUNTER — Other Ambulatory Visit: Payer: Self-pay | Admitting: Adult Health

## 2023-03-26 DIAGNOSIS — F902 Attention-deficit hyperactivity disorder, combined type: Secondary | ICD-10-CM

## 2023-03-26 DIAGNOSIS — F411 Generalized anxiety disorder: Secondary | ICD-10-CM

## 2023-03-26 MED ORDER — ALPRAZOLAM 0.5 MG PO TABS
0.5000 mg | ORAL_TABLET | Freq: Three times a day (TID) | ORAL | 2 refills | Status: DC | PRN
Start: 2023-03-26 — End: 2023-04-19

## 2023-03-30 ENCOUNTER — Ambulatory Visit: Payer: BC Managed Care – PPO | Admitting: Adult Health

## 2023-04-19 ENCOUNTER — Encounter: Payer: Self-pay | Admitting: Adult Health

## 2023-04-19 ENCOUNTER — Ambulatory Visit (INDEPENDENT_AMBULATORY_CARE_PROVIDER_SITE_OTHER): Payer: BC Managed Care – PPO | Admitting: Adult Health

## 2023-04-19 DIAGNOSIS — G2581 Restless legs syndrome: Secondary | ICD-10-CM | POA: Diagnosis not present

## 2023-04-19 DIAGNOSIS — F902 Attention-deficit hyperactivity disorder, combined type: Secondary | ICD-10-CM | POA: Diagnosis not present

## 2023-04-19 DIAGNOSIS — F411 Generalized anxiety disorder: Secondary | ICD-10-CM

## 2023-04-19 DIAGNOSIS — F331 Major depressive disorder, recurrent, moderate: Secondary | ICD-10-CM | POA: Diagnosis not present

## 2023-04-19 DIAGNOSIS — G4700 Insomnia, unspecified: Secondary | ICD-10-CM

## 2023-04-19 MED ORDER — ALPRAZOLAM 1 MG PO TABS
1.0000 mg | ORAL_TABLET | Freq: Three times a day (TID) | ORAL | 2 refills | Status: DC | PRN
Start: 2023-04-19 — End: 2023-10-26

## 2023-04-19 MED ORDER — AMPHETAMINE-DEXTROAMPHET ER 30 MG PO CP24: 30.0000 mg | ORAL_CAPSULE | Freq: Every day | ORAL | 0 refills | Status: DC

## 2023-04-19 NOTE — Progress Notes (Signed)
Tiffany Francis [id number] Jan 20, 1981 42 y.o.  Subjective:   Patient ID:  Tiffany Francis is a 42 y.o. (DOB May 26, 1981) female.  Chief Complaint: No chief complaint on file.   HPI Zell Gloor presents to the office today for follow-up of ADHD, anxiety, depression, insomnia, and RLS.   Describes mood today as "not the best". Pleasant. Tearful at times. Mood symptoms - reports depression - "very little alone time". Reports increased irritability and anxiety. Reports panic attacks - "some". Reports   worry, rumination, and over thinking. Reports obsessive acts - decreased picking - biting nails. Reports increased situational stressors. Mood is up and down. Feels like medications are helpful, but is willing to consider options. Varying interest and motivation. Taking medications as prescribed. Energy levels lower. Active, does not have a regular exercise routine.  Enjoys some usual interests and activities. Married. Lives with husband and 2 sons. Spending time with family.  Appetite adequate. Weight loss - 196 pounds. Sleeps better some nights than others. Averages 5 to 7 hours.  Trying to use CPAP machine - awaiting a new mask. Focus and concentration stable. Completing tasks. Managing some aspects of household. Working at the [employer].  Denies SI or HI.  Denies AH or VH. Denies self harm. Denies substance use.   Review of Systems:  Review of Systems  Musculoskeletal:  Negative for gait problem.  Neurological:  Negative for tremors.  Psychiatric/Behavioral:         Please refer to HPI    Medications: I have reviewed the patient's current medications.  Current Outpatient Medications  Medication Sig Dispense Refill   ALPRAZolam (XANAX) 1 MG tablet Take 1 tablet (1 mg total) by mouth 3 (three) times daily as needed for anxiety. 90 tablet 2   amphetamine-dextroamphetamine (ADDERALL XR) 30 MG 24 hr capsule Take 1 capsule (30 mg total) by mouth daily. 30 capsule 0   desvenlafaxine (PRISTIQ) 100 MG  24 hr tablet Take 1 tablet (100 mg total) by mouth daily. 30 tablet 5   halobetasol (ULTRAVATE) 0.05 % cream Apply topically at bedtime.     meloxicam (MOBIC) 15 MG tablet      omeprazole (PRILOSEC) 40 MG capsule      rizatriptan (MAXALT) 10 MG tablet Take by mouth.     rOPINIRole (REQUIP) 1 MG tablet Take one and 1/2 tabs at bedtime for restless leg syndrome. 45 tablet 5   sertraline (ZOLOFT) 100 MG tablet Take one and 1/2 tablets daily. 45 tablet 5   tiZANidine (ZANAFLEX) 4 MG tablet TK 1 TO 1 AND 1/2 TS PO UP TO TID PRN     topiramate (TOPAMAX) 100 MG tablet Take 100 mg by mouth 2 (two) times daily.     No current facility-administered medications for this visit.    Medication Side Effects: None  Allergies: No Known Allergies  No past medical history on file.  Past Medical History, Surgical history, Social history, and Family history were reviewed and updated as appropriate.   Please see review of systems for further details on the patient's review from today.   Objective:   Physical Exam:  There were no vitals taken for this visit.  Physical Exam Constitutional:      General: She is not in acute distress. Musculoskeletal:        General: No deformity.  Neurological:     Mental Status: She is alert and oriented to person, place, and time.     Coordination: Coordination normal.  Psychiatric:  Attention and Perception: Attention and perception normal. She does not perceive auditory or visual hallucinations.        Mood and Affect: Mood normal. Mood is not anxious or depressed. Affect is not labile, blunt, angry or inappropriate.        Speech: Speech normal.        Behavior: Behavior normal.        Thought Content: Thought content normal. Thought content is not paranoid or delusional. Thought content does not include homicidal or suicidal ideation. Thought content does not include homicidal or suicidal plan.        Cognition and Memory: Cognition and memory normal.         Judgment: Judgment normal.     Comments: Insight intact     Lab Review:     Component Value Date/Time   NA 138 07/10/2008 1020   K 4.1 07/10/2008 1020   CL 107 07/10/2008 1020   CO2 24 07/10/2008 1020   GLUCOSE 90 07/10/2008 1020   BUN 11 07/10/2008 1020   CREATININE 0.66 07/10/2008 1020   CALCIUM 8.7 07/10/2008 1020   PROT 6.7 07/10/2008 1020   ALBUMIN 3.8 07/10/2008 1020   AST 26 07/10/2008 1020   ALT 29 07/10/2008 1020   ALKPHOS 97 07/10/2008 1020   BILITOT 0.6 07/10/2008 1020   GFRNONAA >60 07/10/2008 1020   GFRAA  07/10/2008 1020    >60        The eGFR has been calculated using the MDRD equation. This calculation has not been validated in all clinical       Component Value Date/Time   WBC 10.3 07/10/2008 1020   RBC 4.51 07/10/2008 1020   HGB 11.7 (L) 07/10/2008 1020   HCT 36.2 07/10/2008 1020   PLT 242 07/10/2008 1020   MCV 80.3 07/10/2008 1020   MCHC 32.2 07/10/2008 1020   RDW 15.6 (H) 07/10/2008 1020   LYMPHSABS 0.8 07/10/2008 1020   MONOABS 0.8 07/10/2008 1020   EOSABS 0.1 07/10/2008 1020   BASOSABS 0.1 07/10/2008 1020    No results found for: "POCLITH", "LITHIUM"   No results found for: "PHENYTOIN", "PHENOBARB", "VALPROATE", "CBMZ"   .res Assessment: Plan:    Plan:  Increase Xanax 0.5mg  TID to 1mg  TID temporarily for increased anxiety and panic attacks.  Requip 1.5mg  at bedtime  Adderall XR 30mg  daily Pristiq 100mg  every morning Zoloft 150mg  - one tablet daily.  Monitor BP between visits   RTC 4 weeks  Patient advised to contact office with any questions, adverse effects, or acute worsening in signs and symptoms.  Discussed potential benefits, risks, and side effects of stimulants with patient to include increased heart rate, palpitations, insomnia, increased anxiety, increased irritability, or decreased appetite.  Instructed patient to contact office if experiencing any significant tolerability issues.  Diagnoses and all orders for  this visit:  Major depressive disorder, recurrent episode, moderate (HCC)  Generalized anxiety disorder -     ALPRAZolam (XANAX) 1 MG tablet; Take 1 tablet (1 mg total) by mouth 3 (three) times daily as needed for anxiety.  Attention deficit hyperactivity disorder (ADHD), combined type -     amphetamine-dextroamphetamine (ADDERALL XR) 30 MG 24 hr capsule; Take 1 capsule (30 mg total) by mouth daily.  Restless legs syndrome  Insomnia, unspecified type     Please see After Visit Summary for patient specific instructions.  No future appointments.  No orders of the defined types were placed in this encounter.   -------------------------------

## 2023-07-12 ENCOUNTER — Ambulatory Visit: Payer: BC Managed Care – PPO | Admitting: Adult Health

## 2023-08-02 ENCOUNTER — Encounter: Payer: Self-pay | Admitting: Adult Health

## 2023-08-02 ENCOUNTER — Telehealth (INDEPENDENT_AMBULATORY_CARE_PROVIDER_SITE_OTHER): Payer: BC Managed Care – PPO | Admitting: Adult Health

## 2023-08-02 DIAGNOSIS — F419 Anxiety disorder, unspecified: Secondary | ICD-10-CM | POA: Diagnosis not present

## 2023-08-02 DIAGNOSIS — F331 Major depressive disorder, recurrent, moderate: Secondary | ICD-10-CM

## 2023-08-02 DIAGNOSIS — F902 Attention-deficit hyperactivity disorder, combined type: Secondary | ICD-10-CM

## 2023-08-02 DIAGNOSIS — F909 Attention-deficit hyperactivity disorder, unspecified type: Secondary | ICD-10-CM | POA: Diagnosis not present

## 2023-08-02 DIAGNOSIS — F32A Depression, unspecified: Secondary | ICD-10-CM

## 2023-08-02 DIAGNOSIS — F411 Generalized anxiety disorder: Secondary | ICD-10-CM

## 2023-08-02 DIAGNOSIS — G2581 Restless legs syndrome: Secondary | ICD-10-CM | POA: Diagnosis not present

## 2023-08-02 DIAGNOSIS — G4700 Insomnia, unspecified: Secondary | ICD-10-CM

## 2023-08-02 MED ORDER — AMPHETAMINE-DEXTROAMPHET ER 30 MG PO CP24: 30.0000 mg | ORAL_CAPSULE | Freq: Every day | ORAL | 0 refills | Status: DC

## 2023-08-02 NOTE — Progress Notes (Signed)
Tiffany Francis [id number] 09/02/1980 42 y.o.  Virtual Visit via Video Note  I connected with pt @ on 08/02/23 at  5:00 PM EST by a video enabled telemedicine application and verified that I am speaking with the correct person using two identifiers.   I discussed the limitations of evaluation and management by telemedicine and the availability of in person appointments. The patient expressed understanding and agreed to proceed.  I discussed the assessment and treatment plan with the patient. The patient was provided an opportunity to ask questions and all were answered. The patient agreed with the plan and demonstrated an understanding of the instructions.   The patient was advised to call back or seek an in-person evaluation if the symptoms worsen or if the condition fails to improve as anticipated.  I provided 25 minutes of non-face-to-face time during this encounter.  The patient was located at home.  The provider was located at [facility].   [name], NP   Subjective:   Patient ID:  Tiffany Francis is a 41 y.o. (DOB 09-23-1980) female.  Chief Complaint: No chief complaint on file.   HPI Tiffany Francis presents for follow-up of ADHD, anxiety, depression, insomnia, and RLS.   Describes mood today as "ok". Pleasant. Tearful at times. Mood symptoms - reports some depression - grief/loss issues. Reports decreased interest and motivation. Reports irritability and anxiety. Reports panic attacks. Reports worry, rumination, and over thinking. Reports obsessive acts - reports decreased picking - biting nails. Reports increased situational stressors. Mood is "up and down". Stating "I feel like things are better, but meh". Feels like medications are helpful. Taking medications as prescribed. Energy levels improved. Active, does not have a regular exercise routine.  Enjoys some usual interests and activities. Married. Lives with husband - 2 sons. Spending time with family.  Appetite  adequate. Weight gain - 200 pounds - fluctuating. Sleeps better some nights than others. Averages 5 to 7 hours. Unable to use CPAP machine - awaiting a new mask. Focus and concentration difficulties. Completing tasks. Managing some aspects of household. Working at the [employer].  Denies SI or HI.  Denies AH or VH. Denies self harm. Denies substance use.   Review of Systems:  Review of Systems  Musculoskeletal:  Negative for gait problem.  Neurological:  Negative for tremors.  Psychiatric/Behavioral:         Please refer to HPI    Medications: I have reviewed the patient's current medications.  Current Outpatient Medications  Medication Sig Dispense Refill   ALPRAZolam (XANAX) 1 MG tablet Take 1 tablet (1 mg total) by mouth 3 (three) times daily as needed for anxiety. 90 tablet 2   amphetamine-dextroamphetamine (ADDERALL XR) 30 MG 24 hr capsule Take 1 capsule (30 mg total) by mouth daily. 30 capsule 0   desvenlafaxine (PRISTIQ) 100 MG 24 hr tablet Take 1 tablet (100 mg total) by mouth daily. 30 tablet 5   halobetasol (ULTRAVATE) 0.05 % cream Apply topically at bedtime.     meloxicam (MOBIC) 15 MG tablet      omeprazole (PRILOSEC) 40 MG capsule      rizatriptan (MAXALT) 10 MG tablet Take by mouth.     rOPINIRole (REQUIP) 1 MG tablet Take one and 1/2 tabs at bedtime for restless leg syndrome. 45 tablet 5   sertraline (ZOLOFT) 100 MG tablet Take one and 1/2 tablets daily. 45 tablet 5   tiZANidine (ZANAFLEX) 4 MG tablet TK 1 TO 1 AND 1/2 TS PO UP TO TID PRN  topiramate (TOPAMAX) 100 MG tablet Take 100 mg by mouth 2 (two) times daily.     No current facility-administered medications for this visit.    Medication Side Effects: None  Allergies: No Known Allergies  No past medical history on file.  No family history on file.  Social History   Socioeconomic History   Marital status: Married    Spouse name: Not on file   Number of children: Not on file   Years of education: Not on  file   Highest education level: Not on file  Occupational History   Not on file  Tobacco Use   Smoking status: Never   Smokeless tobacco: Never  Substance and Sexual Activity   Alcohol use: Not on file   Drug use: Not on file   Sexual activity: Not on file  Other Topics Concern   Not on file  Social History Narrative   Not on file   Social Drivers of Health   Financial Resource Strain: Not on file  Food Insecurity: Not on file  Transportation Needs: Not on file  Physical Activity: Not on file  Stress: Not on file  Social Connections: Not on file  Intimate Partner Violence: Not on file    Past Medical History, Surgical history, Social history, and Family history were reviewed and updated as appropriate.   Please see review of systems for further details on the patient's review from today.   Objective:   Physical Exam:  There were no vitals taken for this visit.  Physical Exam Constitutional:      General: She is not in acute distress. Musculoskeletal:        General: No deformity.  Neurological:     Mental Status: She is alert and oriented to person, place, and time.     Coordination: Coordination normal.  Psychiatric:        Attention and Perception: Attention and perception normal. She does not perceive auditory or visual hallucinations.        Mood and Affect: Mood normal. Mood is not anxious or depressed. Affect is not labile, blunt, angry or inappropriate.        Speech: Speech normal.        Behavior: Behavior normal.        Thought Content: Thought content normal. Thought content is not paranoid or delusional. Thought content does not include homicidal or suicidal ideation. Thought content does not include homicidal or suicidal plan.        Cognition and Memory: Cognition and memory normal.        Judgment: Judgment normal.     Comments: Insight intact     Lab Review:     Component Value Date/Time   NA 138 07/10/2008 1020   K 4.1 07/10/2008 1020   CL  107 07/10/2008 1020   CO2 24 07/10/2008 1020   GLUCOSE 90 07/10/2008 1020   BUN 11 07/10/2008 1020   CREATININE 0.66 07/10/2008 1020   CALCIUM 8.7 07/10/2008 1020   PROT 6.7 07/10/2008 1020   ALBUMIN 3.8 07/10/2008 1020   AST 26 07/10/2008 1020   ALT 29 07/10/2008 1020   ALKPHOS 97 07/10/2008 1020   BILITOT 0.6 07/10/2008 1020   GFRNONAA >60 07/10/2008 1020   GFRAA  07/10/2008 1020    >60        The eGFR has been calculated using the MDRD equation. This calculation has not been validated in all clinical       Component Value Date/Time  WBC 10.3 07/10/2008 1020   RBC 4.51 07/10/2008 1020   HGB 11.7 (L) 07/10/2008 1020   HCT 36.2 07/10/2008 1020   PLT 242 07/10/2008 1020   MCV 80.3 07/10/2008 1020   MCHC 32.2 07/10/2008 1020   RDW 15.6 (H) 07/10/2008 1020   LYMPHSABS 0.8 07/10/2008 1020   MONOABS 0.8 07/10/2008 1020   EOSABS 0.1 07/10/2008 1020   BASOSABS 0.1 07/10/2008 1020    No results found for: "POCLITH", "LITHIUM"   No results found for: "PHENYTOIN", "PHENOBARB", "VALPROATE", "CBMZ"   .res Assessment: Plan:    Plan:  Increase Xanax 0.5mg  TID to 1mg  TID temporarily for increased anxiety and panic attacks.  Requip 1.5mg  at bedtime  Adderall XR 30mg  daily Pristiq 100mg  every morning Zoloft 150mg  - one tablet daily.  Monitor BP between visits   RTC 3 months  Patient advised to contact office with any questions, adverse effects, or acute worsening in signs and symptoms.  Discussed potential benefits, risks, and side effects of stimulants with patient to include increased heart rate, palpitations, insomnia, increased anxiety, increased irritability, or decreased appetite.  Instructed patient to contact office if experiencing any significant tolerability issues.  There are no diagnoses linked to this encounter.   Please see After Visit Summary for patient specific instructions.  Future Appointments  Date Time Provider Department Center  08/02/2023   5:00 PM Suleima Ohlendorf, Thereasa Solo, NP CP-CP None    No orders of the defined types were placed in this encounter.     -------------------------------

## 2023-09-02 ENCOUNTER — Other Ambulatory Visit: Payer: Self-pay | Admitting: Adult Health

## 2023-09-02 DIAGNOSIS — F902 Attention-deficit hyperactivity disorder, combined type: Secondary | ICD-10-CM

## 2023-09-02 MED ORDER — AMPHETAMINE-DEXTROAMPHET ER 30 MG PO CP24: 30.0000 mg | ORAL_CAPSULE | Freq: Every day | ORAL | 0 refills | Status: DC

## 2023-09-24 ENCOUNTER — Other Ambulatory Visit: Payer: Self-pay | Admitting: Adult Health

## 2023-09-24 DIAGNOSIS — F411 Generalized anxiety disorder: Secondary | ICD-10-CM

## 2023-09-24 DIAGNOSIS — F331 Major depressive disorder, recurrent, moderate: Secondary | ICD-10-CM

## 2023-10-11 ENCOUNTER — Other Ambulatory Visit: Payer: Self-pay

## 2023-10-11 ENCOUNTER — Telehealth: Payer: Self-pay | Admitting: Adult Health

## 2023-10-11 DIAGNOSIS — F902 Attention-deficit hyperactivity disorder, combined type: Secondary | ICD-10-CM

## 2023-10-11 MED ORDER — AMPHETAMINE-DEXTROAMPHET ER 30 MG PO CP24
30.0000 mg | ORAL_CAPSULE | Freq: Every day | ORAL | 0 refills | Status: DC
Start: 2023-10-11 — End: 2023-10-26

## 2023-10-11 NOTE — Telephone Encounter (Signed)
 Pended Adderall to [facility] in [place]

## 2023-10-11 NOTE — Telephone Encounter (Signed)
 Pt called at 9:14a requesting refill of Adderall to   [id number] - [place], [place] - [address], [place] [address] Phone: [phone]  Fax: [phone]   Next appt 3/25

## 2023-10-26 ENCOUNTER — Encounter: Payer: Self-pay | Admitting: Adult Health

## 2023-10-26 ENCOUNTER — Telehealth: Payer: BC Managed Care – PPO | Admitting: Adult Health

## 2023-10-26 DIAGNOSIS — F419 Anxiety disorder, unspecified: Secondary | ICD-10-CM | POA: Diagnosis not present

## 2023-10-26 DIAGNOSIS — F32A Depression, unspecified: Secondary | ICD-10-CM

## 2023-10-26 DIAGNOSIS — F331 Major depressive disorder, recurrent, moderate: Secondary | ICD-10-CM

## 2023-10-26 DIAGNOSIS — G2581 Restless legs syndrome: Secondary | ICD-10-CM | POA: Diagnosis not present

## 2023-10-26 DIAGNOSIS — F411 Generalized anxiety disorder: Secondary | ICD-10-CM

## 2023-10-26 DIAGNOSIS — F902 Attention-deficit hyperactivity disorder, combined type: Secondary | ICD-10-CM

## 2023-10-26 DIAGNOSIS — F909 Attention-deficit hyperactivity disorder, unspecified type: Secondary | ICD-10-CM

## 2023-10-26 MED ORDER — AMPHETAMINE-DEXTROAMPHET ER 30 MG PO CP24: 30.0000 mg | ORAL_CAPSULE | Freq: Every day | ORAL | 0 refills | Status: DC

## 2023-10-26 MED ORDER — ALPRAZOLAM 1 MG PO TABS: 1.0000 mg | ORAL_TABLET | Freq: Two times a day (BID) | ORAL | 2 refills | Status: DC | PRN

## 2023-10-26 MED ORDER — ROPINIROLE HCL 1 MG PO TABS: ORAL_TABLET | ORAL | 5 refills | Status: DC

## 2023-10-26 MED ORDER — DESVENLAFAXINE SUCCINATE ER 100 MG PO TB24: 100.0000 mg | ORAL_TABLET | Freq: Every day | ORAL | 2 refills | Status: DC

## 2023-10-26 MED ORDER — SERTRALINE HCL 100 MG PO TABS: ORAL_TABLET | ORAL | 5 refills | Status: DC

## 2023-10-26 NOTE — Progress Notes (Signed)
 Tiffany Francis [id number] 1980-12-01 43 y.o.  Virtual Visit via Video Note  I connected with pt @ on 10/26/23 at  5:30 PM EDT by a video enabled telemedicine application and verified that I am speaking with the correct person using two identifiers.   I discussed the limitations of evaluation and management by telemedicine and the availability of in person appointments. The patient expressed understanding and agreed to proceed.  I discussed the assessment and treatment plan with the patient. The patient was provided an opportunity to ask questions and all were answered. The patient agreed with the plan and demonstrated an understanding of the instructions.   The patient was advised to call back or seek an in-person evaluation if the symptoms worsen or if the condition fails to improve as anticipated.  I provided 25 minutes of non-face-to-face time during this encounter.  The patient was located at home.  The provider was located at [facility].   [name], NP   Subjective:   Patient ID:  Tiffany Francis is a 43 y.o. (DOB [birth year]) female.  Chief Complaint: No chief complaint on file.   HPI Jame Seelig presents for follow-up of ADHD, anxiety, depression, insomnia and RLS.   Describes mood today as "ok". Pleasant. Tearful at times. Mood symptoms - reports some depression - trying to set goals and get things done. Denies anxiety and irritability. Reports decreased interest and motivation. Reports panic attacks. Reports worry, rumination and over thinking. Reports obsessive acts - reports picking - biting nails. Reports ongoing situational stressors - oldest son. Mood is "up and down". Stating "I feel like I could be doing better". Feels like medications are helpful. Taking medications as prescribed. Energy levels vary. Active, does not have a regular exercise routine.  Enjoys some usual interests and activities. Married. Lives with husband - 2 sons. Spending time with  family.  Appetite adequate. Weight gain - 200 to 213 pounds - fluctuating. Sleeps better some nights than others. Averages 5 to 7 hours. Unable to use CPAP machine - awaiting a new mask. Focus and concentration difficulties. Completing tasks. Managing some aspects of household. Working at the [employer].  Denies SI or HI.  Denies AH or VH. Denies self harm. Denies substance use. Reports picking behaviors and anil biting nails.  Review of Systems:  Review of Systems  Musculoskeletal:  Negative for gait problem.  Neurological:  Negative for tremors.  Psychiatric/Behavioral:         Please refer to HPI    Medications: I have reviewed the patient's current medications.  Current Outpatient Medications  Medication Sig Dispense Refill   ALPRAZolam (XANAX) 1 MG tablet Take 1 tablet (1 mg total) by mouth 3 (three) times daily as needed for anxiety. 90 tablet 2   amphetamine-dextroamphetamine (ADDERALL XR) 30 MG 24 hr capsule Take 1 capsule (30 mg total) by mouth daily. 30 capsule 0   desvenlafaxine (PRISTIQ) 100 MG 24 hr tablet TAKE 1 TABLET(100 MG) BY MOUTH DAILY 30 tablet 0   halobetasol (ULTRAVATE) 0.05 % cream Apply topically at bedtime.     meloxicam (MOBIC) 15 MG tablet      omeprazole (PRILOSEC) 40 MG capsule      rizatriptan (MAXALT) 10 MG tablet Take by mouth.     rOPINIRole (REQUIP) 1 MG tablet Take one and 1/2 tabs at bedtime for restless leg syndrome. 45 tablet 5   sertraline (ZOLOFT) 100 MG tablet Take one and 1/2 tablets daily. 45 tablet 5   tiZANidine (ZANAFLEX) 4  MG tablet TK 1 TO 1 AND 1/2 TS PO UP TO TID PRN     topiramate (TOPAMAX) 100 MG tablet Take 100 mg by mouth 2 (two) times daily.     No current facility-administered medications for this visit.    Medication Side Effects: None  Allergies: No Known Allergies  No past medical history on file.  No family history on file.  Social History   Socioeconomic History   Marital status: Married    Spouse name: Not on  file   Number of children: Not on file   Years of education: Not on file   Highest education level: Not on file  Occupational History   Not on file  Tobacco Use   Smoking status: Never   Smokeless tobacco: Never  Substance and Sexual Activity   Alcohol use: Not on file   Drug use: Not on file   Sexual activity: Not on file  Other Topics Concern   Not on file  Social History Narrative   Not on file   Social Drivers of Health   Financial Resource Strain: Not on file  Food Insecurity: Medium Risk (08/18/2023)   Received from [facility]   Hunger Vital Sign    Worried About Running Out of Food in the Last Year: Never true    Ran Out of Food in the Last Year: Sometimes true  Transportation Needs: No Transportation Needs (08/18/2023)   Received from [facility]    In the past 12 months, has lack of reliable transportation kept you from medical appointments, meetings, work or from getting things needed for daily living? : No  Physical Activity: Not on file  Stress: Not on file  Social Connections: Not on file  Intimate Partner Violence: Not on file    Past Medical History, Surgical history, Social history, and Family history were reviewed and updated as appropriate.   Please see review of systems for further details on the patient's review from today.   Objective:   Physical Exam:  There were no vitals taken for this visit.  Physical Exam Constitutional:      General: She is not in acute distress. Musculoskeletal:        General: No deformity.  Neurological:     Mental Status: She is alert and oriented to person, place, and time.     Coordination: Coordination normal.  Psychiatric:        Attention and Perception: Attention and perception normal. She does not perceive auditory or visual hallucinations.        Mood and Affect: Affect is not labile, blunt, angry or inappropriate.        Speech: Speech normal.        Behavior: Behavior normal.         Thought Content: Thought content normal. Thought content is not paranoid or delusional. Thought content does not include homicidal or suicidal ideation. Thought content does not include homicidal or suicidal plan.        Cognition and Memory: Cognition and memory normal.        Judgment: Judgment normal.     Comments: Insight intact     Lab Review:     Component Value Date/Time   NA 138 07/10/2008 1020   K 4.1 07/10/2008 1020   CL 107 07/10/2008 1020   CO2 24 07/10/2008 1020   GLUCOSE 90 07/10/2008 1020   BUN 11 07/10/2008 1020   CREATININE 0.66 07/10/2008 1020   CALCIUM 8.7  07/10/2008 1020   PROT 6.7 07/10/2008 1020   ALBUMIN 3.8 07/10/2008 1020   AST 26 07/10/2008 1020   ALT 29 07/10/2008 1020   ALKPHOS 97 07/10/2008 1020   BILITOT 0.6 07/10/2008 1020   GFRNONAA >60 07/10/2008 1020   GFRAA  07/10/2008 1020    >60        The eGFR has been calculated using the MDRD equation. This calculation has not been validated in all clinical       Component Value Date/Time   WBC 10.3 07/10/2008 1020   RBC 4.51 07/10/2008 1020   HGB 11.7 (L) 07/10/2008 1020   HCT 36.2 07/10/2008 1020   PLT 242 07/10/2008 1020   MCV 80.3 07/10/2008 1020   MCHC 32.2 07/10/2008 1020   RDW 15.6 (H) 07/10/2008 1020   LYMPHSABS 0.8 07/10/2008 1020   MONOABS 0.8 07/10/2008 1020   EOSABS 0.1 07/10/2008 1020   BASOSABS 0.1 07/10/2008 1020    No results found for: "POCLITH", "LITHIUM"   No results found for: "PHENYTOIN", "PHENOBARB", "VALPROATE", "CBMZ"   .res Assessment: Plan:    Plan:  Xanax 1mg  daily to TID to as needed for increased anxiety and panic attacks. Reports weaning herself down.   Requip 1.5mg  at bedtime  Adderall XR 30mg  daily Pristiq 100mg  every morning  Zoloft 150mg  - one tablet daily.  Monitor BP between visits   Working [facility] a [facility] - [facility]  RTC 3 months  25 minutes spent dedicated to the care of this patient on the date of this encounter to  include pre-visit review of records, ordering of medication, post visit documentation, and face-to-face time with the patient discussing ADHD, anxiety, depression, insomnia, and RLS. Discussed continuing current medication regimen.  Patient advised to contact office with any questions, adverse effects, or acute worsening in signs and symptoms.  Discussed potential benefits, risks, and side effects of stimulants with patient to include increased heart rate, palpitations, insomnia, increased anxiety, increased irritability, or decreased appetite.  Instructed patient to contact office if experiencing any significant tolerability issues.  There are no diagnoses linked to this encounter.   Please see After Visit Summary for patient specific instructions.  Future Appointments  Date Time Provider Department Center  10/26/2023  5:30 PM Avi Kerschner, Thereasa Solo, NP [facility] None    No orders of the defined types were placed in this encounter.     -------------------------------

## 2024-03-29 ENCOUNTER — Other Ambulatory Visit: Payer: Self-pay

## 2024-03-29 DIAGNOSIS — F331 Major depressive disorder, recurrent, moderate: Secondary | ICD-10-CM

## 2024-03-29 DIAGNOSIS — F411 Generalized anxiety disorder: Secondary | ICD-10-CM

## 2024-03-29 MED ORDER — DESVENLAFAXINE SUCCINATE ER 100 MG PO TB24: 100.0000 mg | ORAL_TABLET | Freq: Every day | ORAL | 0 refills | Status: DC

## 2024-04-10 ENCOUNTER — Telehealth: Payer: Self-pay | Admitting: Adult Health

## 2024-04-10 ENCOUNTER — Other Ambulatory Visit: Payer: Self-pay | Admitting: Adult Health

## 2024-04-10 DIAGNOSIS — F902 Attention-deficit hyperactivity disorder, combined type: Secondary | ICD-10-CM

## 2024-04-10 MED ORDER — AMPHETAMINE-DEXTROAMPHET ER 30 MG PO CP24: 30.0000 mg | ORAL_CAPSULE | Freq: Every day | ORAL | 0 refills | Status: DC

## 2024-04-10 NOTE — Telephone Encounter (Signed)
 Pt has a RF available.

## 2024-04-10 NOTE — Telephone Encounter (Signed)
 PT needs Adderall rf  Next appt 9/25    [facility] [address]  [place]

## 2024-04-25 ENCOUNTER — Other Ambulatory Visit: Payer: Self-pay | Admitting: Adult Health

## 2024-04-25 DIAGNOSIS — F331 Major depressive disorder, recurrent, moderate: Secondary | ICD-10-CM

## 2024-04-25 DIAGNOSIS — F411 Generalized anxiety disorder: Secondary | ICD-10-CM

## 2024-04-27 ENCOUNTER — Encounter: Payer: Self-pay | Admitting: Adult Health

## 2024-04-27 ENCOUNTER — Telehealth: Admitting: Adult Health

## 2024-04-27 DIAGNOSIS — F419 Anxiety disorder, unspecified: Secondary | ICD-10-CM | POA: Diagnosis not present

## 2024-04-27 DIAGNOSIS — F902 Attention-deficit hyperactivity disorder, combined type: Secondary | ICD-10-CM

## 2024-04-27 DIAGNOSIS — F411 Generalized anxiety disorder: Secondary | ICD-10-CM

## 2024-04-27 DIAGNOSIS — F32A Depression, unspecified: Secondary | ICD-10-CM

## 2024-04-27 DIAGNOSIS — G2581 Restless legs syndrome: Secondary | ICD-10-CM

## 2024-04-27 DIAGNOSIS — F909 Attention-deficit hyperactivity disorder, unspecified type: Secondary | ICD-10-CM

## 2024-04-27 DIAGNOSIS — G4700 Insomnia, unspecified: Secondary | ICD-10-CM

## 2024-04-27 DIAGNOSIS — F331 Major depressive disorder, recurrent, moderate: Secondary | ICD-10-CM

## 2024-04-27 MED ORDER — AMPHETAMINE-DEXTROAMPHET ER 30 MG PO CP24: 30.0000 mg | ORAL_CAPSULE | Freq: Every day | ORAL | 0 refills | Status: DC

## 2024-04-27 MED ORDER — AMPHETAMINE-DEXTROAMPHET ER 30 MG PO CP24: 30.0000 mg | ORAL_CAPSULE | Freq: Every day | ORAL | 0 refills | Status: AC

## 2024-04-27 MED ORDER — ALPRAZOLAM 1 MG PO TABS: 1.0000 mg | ORAL_TABLET | Freq: Two times a day (BID) | ORAL | 2 refills | Status: AC | PRN

## 2024-04-27 MED ORDER — DESVENLAFAXINE SUCCINATE ER 100 MG PO TB24: 100.0000 mg | ORAL_TABLET | Freq: Every day | ORAL | 5 refills | Status: AC

## 2024-04-27 MED ORDER — SERTRALINE HCL 100 MG PO TABS: ORAL_TABLET | ORAL | 5 refills | Status: DC

## 2024-04-27 MED ORDER — ROPINIROLE HCL 1 MG PO TABS: ORAL_TABLET | ORAL | 5 refills | Status: AC

## 2024-04-27 NOTE — Telephone Encounter (Signed)
 Has appt today

## 2024-04-27 NOTE — Progress Notes (Unsigned)
 Tiffany Francis [id number] 09-27-1980 43 y.o.  Virtual Visit via Video Note  I connected with pt @ on 04/27/24 at  5:30 PM EDT by a video enabled telemedicine application and verified that I am speaking with the correct person using two identifiers.   I discussed the limitations of evaluation and management by telemedicine and the availability of in person appointments. The patient expressed understanding and agreed to proceed.  I discussed the assessment and treatment plan with the patient. The patient was provided an opportunity to ask questions and all were answered. The patient agreed with the plan and demonstrated an understanding of the instructions.   The patient was advised to call back or seek an in-person evaluation if the symptoms worsen or if the condition fails to improve as anticipated.  I provided 25 minutes of non-face-to-face time during this encounter.  The patient was located at home.  The provider was located at [facility].   Tiffany LOISE Sayers, NP   Subjective:   Patient ID:  Tiffany Francis is a 43 y.o. (DOB 12-01-1980) female.  Chief Complaint: No chief complaint on file.   HPI Tiffany Francis presents for follow-up of ADHD, anxiety, depression, insomnia and RLS.   Describes mood today as ok. Pleasant. Tearful at times. Mood symptoms - reports some depression, anxiety and irritability. Reports varying interest and motivation. Reports panic attacks - not real bad lately. Reports worry, rumination and over thinking. Reports obsessive acts - reports picking - biting nails. Reports ongoing situational stressors. Reports mood is variable - up and down. Stating I feel like I'm doing the best I can. Feels like medications are helpful. Taking medications as prescribed. Energy levels vary. Active, does not have a regular exercise routine.  Enjoys some usual interests and activities. Married. Lives with husband - 2 sons. Spending time with family.  Appetite adequate.  Weight gain - 200+ pounds  Sleeps better some nights than others. Averages 5 to 7 hours - CPAP machine.   Focus and concentration difficulties. Completing tasks. Managing some aspects of household. Working at the [employer].  Denies SI or HI.  Denies AH or VH. Denies self harm. Denies substance use. Reports picking behaviors and anil biting nails.  Review of Systems:  Review of Systems  Musculoskeletal:  Negative for gait problem.  Neurological:  Negative for tremors.  Psychiatric/Behavioral:         Please refer to HPI    Medications: I have reviewed the patient's current medications.  Current Outpatient Medications  Medication Sig Dispense Refill   ALPRAZolam  (XANAX ) 1 MG tablet Take 1 tablet (1 mg total) by mouth 2 (two) times daily as needed for anxiety. 60 tablet 2   amphetamine -dextroamphetamine  (ADDERALL XR) 30 MG 24 hr capsule Take 1 capsule (30 mg total) by mouth daily. 30 capsule 0   amphetamine -dextroamphetamine  (ADDERALL XR) 30 MG 24 hr capsule Take 1 capsule (30 mg total) by mouth daily. 30 capsule 0   amphetamine -dextroamphetamine  (ADDERALL XR) 30 MG 24 hr capsule Take 1 capsule (30 mg total) by mouth daily. 30 capsule 0   desvenlafaxine  (PRISTIQ ) 100 MG 24 hr tablet Take 1 tablet (100 mg total) by mouth daily. 30 tablet 0   halobetasol (ULTRAVATE) 0.05 % cream Apply topically at bedtime.     meloxicam (MOBIC) 15 MG tablet      omeprazole (PRILOSEC) 40 MG capsule      rizatriptan (MAXALT) 10 MG tablet Take by mouth.     rOPINIRole  (REQUIP ) 1 MG tablet Take  one and 1/2 tabs at bedtime for restless leg syndrome. 45 tablet 5   sertraline  (ZOLOFT ) 100 MG tablet Take one and 1/2 tablets daily. 45 tablet 5   tiZANidine (ZANAFLEX) 4 MG tablet TK 1 TO 1 AND 1/2 TS PO UP TO TID PRN     topiramate (TOPAMAX) 100 MG tablet Take 100 mg by mouth 2 (two) times daily.     No current facility-administered medications for this visit.    Medication Side Effects: None  Allergies: No Known  Allergies  No past medical history on file.  No family history on file.  Social History   Socioeconomic History   Marital status: Married    Spouse name: Not on file   Number of children: Not on file   Years of education: Not on file   Highest education level: Not on file  Occupational History   Not on file  Tobacco Use   Smoking status: Never   Smokeless tobacco: Never  Substance and Sexual Activity   Alcohol use: Not on file   Drug use: Not on file   Sexual activity: Not on file  Other Topics Concern   Not on file  Social History Narrative   Not on file   Social Drivers of Health   Financial Resource Strain: Not on file  Food Insecurity: Medium Risk (08/18/2023)   Received from [facility]   Hunger Vital Sign    Within the past 12 months, you worried that your food would run out before you got money to buy more: Never true    Within the past 12 months, the food you bought just didn't last and you didn't have money to get more. : Sometimes true  Transportation Needs: No Transportation Needs (08/18/2023)   Received from [facility]    In the past 12 months, has lack of reliable transportation kept you from medical appointments, meetings, work or from getting things needed for daily living? : No  Physical Activity: Not on file  Stress: Not on file  Social Connections: Not on file  Intimate Partner Violence: Not on file    Past Medical History, Surgical history, Social history, and Family history were reviewed and updated as appropriate.   Please see review of systems for further details on the patient's review from today.   Objective:   Physical Exam:  There were no vitals taken for this visit.  Physical Exam Constitutional:      General: She is not in acute distress. Musculoskeletal:        General: No deformity.  Neurological:     Mental Status: She is alert and oriented to person, place, and time.     Coordination: Coordination  normal.  Psychiatric:        Attention and Perception: Attention and perception normal. She does not perceive auditory or visual hallucinations.        Mood and Affect: Mood normal. Mood is not anxious or depressed. Affect is not labile, blunt, angry or inappropriate.        Speech: Speech normal.        Behavior: Behavior normal.        Thought Content: Thought content normal. Thought content is not paranoid or delusional. Thought content does not include homicidal or suicidal ideation. Thought content does not include homicidal or suicidal plan.        Cognition and Memory: Cognition and memory normal.        Judgment: Judgment normal.  Comments: Insight intact     Lab Review:     Component Value Date/Time   NA 138 07/10/2008 1020   K 4.1 07/10/2008 1020   CL 107 07/10/2008 1020   CO2 24 07/10/2008 1020   GLUCOSE 90 07/10/2008 1020   BUN 11 07/10/2008 1020   CREATININE 0.66 07/10/2008 1020   CALCIUM 8.7 07/10/2008 1020   PROT 6.7 07/10/2008 1020   ALBUMIN 3.8 07/10/2008 1020   AST 26 07/10/2008 1020   ALT 29 07/10/2008 1020   ALKPHOS 97 07/10/2008 1020   BILITOT 0.6 07/10/2008 1020   GFRNONAA >60 07/10/2008 1020   GFRAA  07/10/2008 1020    >60        The eGFR has been calculated using the MDRD equation. This calculation has not been validated in all clinical       Component Value Date/Time   WBC 10.3 07/10/2008 1020   RBC 4.51 07/10/2008 1020   HGB 11.7 (L) 07/10/2008 1020   HCT 36.2 07/10/2008 1020   PLT 242 07/10/2008 1020   MCV 80.3 07/10/2008 1020   MCHC 32.2 07/10/2008 1020   RDW 15.6 (H) 07/10/2008 1020   LYMPHSABS 0.8 07/10/2008 1020   MONOABS 0.8 07/10/2008 1020   EOSABS 0.1 07/10/2008 1020   BASOSABS 0.1 07/10/2008 1020    No results found for: POCLITH, LITHIUM   No results found for: PHENYTOIN, PHENOBARB, VALPROATE, CBMZ   .res Assessment: Plan:    Plan:  Xanax  1mg  daily to TID to as needed for increased anxiety and panic  attacks.  Requip  1.5mg  at bedtime  Adderall XR 30mg  daily Pristiq  100mg  every morning Zoloft  150mg  - one and 1/2 tablets daily.  Monitor BP between visits   Working with a therapist - [facility] - had to stop due to insurance issue.  RTC 3 months  25 minutes spent dedicated to the care of this patient on the date of this encounter to include pre-visit review of records, ordering of medication, post visit documentation, and face-to-face time with the patient discussing ADHD, anxiety, depression, insomnia, and RLS. Discussed continuing current medication regimen.  Patient advised to contact office with any questions, adverse effects, or acute worsening in signs and symptoms.  Discussed potential benefits, risks, and side effects of stimulants with patient to include increased heart rate, palpitations, insomnia, increased anxiety, increased irritability, or decreased appetite.  Instructed patient to contact office if experiencing any significant tolerability issues.  There are no diagnoses linked to this encounter.   Please see After Visit Summary for patient specific instructions.  Future Appointments  Date Time Provider Department Center  04/27/2024  5:30 PM Tiffany Leisey Nattalie, NP [facility] None    No orders of the defined types were placed in this encounter.     -------------------------------

## 2024-05-18 ENCOUNTER — Other Ambulatory Visit: Payer: Self-pay | Admitting: Adult Health

## 2024-05-18 DIAGNOSIS — F902 Attention-deficit hyperactivity disorder, combined type: Secondary | ICD-10-CM

## 2024-05-18 MED ORDER — AMPHETAMINE-DEXTROAMPHET ER 30 MG PO CP24: 30.0000 mg | ORAL_CAPSULE | Freq: Every day | ORAL | 0 refills | Status: AC

## 2024-05-31 ENCOUNTER — Other Ambulatory Visit: Payer: Self-pay | Admitting: Adult Health

## 2024-05-31 DIAGNOSIS — F411 Generalized anxiety disorder: Secondary | ICD-10-CM

## 2024-07-23 ENCOUNTER — Other Ambulatory Visit: Payer: Self-pay | Admitting: Medical Genetics
